# Patient Record
Sex: Male | Born: 1965 | Race: White | Hispanic: No | Smoking: Current every day smoker
Health system: Southern US, Community
[De-identification: ages and names within clinical notes are randomized; demographics above are authoritative.]

## PROBLEM LIST (undated history)

## (undated) DIAGNOSIS — Z720 Tobacco use: Secondary | ICD-10-CM

## (undated) DIAGNOSIS — M1990 Unspecified osteoarthritis, unspecified site: Secondary | ICD-10-CM

## (undated) DIAGNOSIS — I10 Essential (primary) hypertension: Secondary | ICD-10-CM

## (undated) DIAGNOSIS — I82409 Acute embolism and thrombosis of unspecified deep veins of unspecified lower extremity: Secondary | ICD-10-CM

## (undated) DIAGNOSIS — C439 Malignant melanoma of skin, unspecified: Secondary | ICD-10-CM

## (undated) DIAGNOSIS — Z951 Presence of aortocoronary bypass graft: Secondary | ICD-10-CM

## (undated) DIAGNOSIS — I2510 Atherosclerotic heart disease of native coronary artery without angina pectoris: Secondary | ICD-10-CM

## (undated) DIAGNOSIS — I214 Non-ST elevation (NSTEMI) myocardial infarction: Secondary | ICD-10-CM

## (undated) DIAGNOSIS — E7800 Pure hypercholesterolemia, unspecified: Secondary | ICD-10-CM

## (undated) HISTORY — PX: KNEE ARTHROSCOPY: SHX127

## (undated) HISTORY — PX: BACK SURGERY: SHX140

## (undated) HISTORY — DX: Acute embolism and thrombosis of unspecified deep veins of unspecified lower extremity: I82.409

## (undated) HISTORY — PX: TOTAL ANKLE REPLACEMENT: SUR1218

## (undated) HISTORY — PX: SHOULDER SURGERY: SHX246

## (undated) HISTORY — PX: HIP SURGERY: SHX245

---

## 2006-05-02 ENCOUNTER — Inpatient Hospital Stay: Payer: Self-pay | Admitting: Unknown Physician Specialty

## 2006-05-03 ENCOUNTER — Other Ambulatory Visit: Payer: Self-pay

## 2006-11-10 ENCOUNTER — Ambulatory Visit (HOSPITAL_COMMUNITY): Admission: RE | Admit: 2006-11-10 | Discharge: 2006-11-10 | Payer: Self-pay | Admitting: General Surgery

## 2008-06-04 ENCOUNTER — Ambulatory Visit: Payer: Self-pay

## 2008-07-26 ENCOUNTER — Other Ambulatory Visit: Payer: Self-pay

## 2008-07-26 ENCOUNTER — Ambulatory Visit: Payer: Self-pay | Admitting: Unknown Physician Specialty

## 2008-08-02 ENCOUNTER — Ambulatory Visit: Payer: Self-pay | Admitting: Unknown Physician Specialty

## 2012-03-04 ENCOUNTER — Inpatient Hospital Stay: Payer: Self-pay | Admitting: Surgery

## 2012-03-04 LAB — COMPREHENSIVE METABOLIC PANEL
Albumin: 3.5 g/dL (ref 3.4–5.0)
Alkaline Phosphatase: 96 U/L (ref 50–136)
BUN: 12 mg/dL (ref 7–18)
Bilirubin,Total: 1.1 mg/dL — ABNORMAL HIGH (ref 0.2–1.0)
Calcium, Total: 8.8 mg/dL (ref 8.5–10.1)
Chloride: 103 mmol/L (ref 98–107)
Co2: 27 mmol/L (ref 21–32)
Creatinine: 1.1 mg/dL (ref 0.60–1.30)
EGFR (African American): >60
EGFR (Non-African Amer.): >60
Osmolality: 272 (ref 275–301)
Potassium: 3.7 mmol/L (ref 3.5–5.1)
SGOT(AST): 17 U/L (ref 15–37)
SGPT (ALT): 26 U/L
Sodium: 137 mmol/L (ref 136–145)
Total Protein: 7.8 g/dL (ref 6.4–8.2)

## 2012-03-04 LAB — CBC
HCT: 44.9 % (ref 40.0–52.0)
MCH: 30 pg (ref 26.0–34.0)
MCHC: 34.3 g/dL (ref 32.0–36.0)
MCV: 88 fL (ref 80–100)
Platelet: 200 10*3/uL (ref 150–440)
RDW: 14.8 % — ABNORMAL HIGH (ref 11.5–14.5)

## 2012-03-10 LAB — WOUND CULTURE

## 2014-04-03 ENCOUNTER — Other Ambulatory Visit: Payer: Self-pay | Admitting: *Deleted

## 2014-04-03 ENCOUNTER — Encounter (HOSPITAL_COMMUNITY): Payer: Self-pay | Admitting: Emergency Medicine

## 2014-04-03 ENCOUNTER — Inpatient Hospital Stay (HOSPITAL_COMMUNITY): Payer: BC Managed Care – PPO

## 2014-04-03 ENCOUNTER — Emergency Department (HOSPITAL_COMMUNITY): Payer: BC Managed Care – PPO

## 2014-04-03 ENCOUNTER — Other Ambulatory Visit: Payer: Self-pay

## 2014-04-03 ENCOUNTER — Inpatient Hospital Stay (HOSPITAL_COMMUNITY)
Admission: EM | Admit: 2014-04-03 | Discharge: 2014-04-09 | DRG: 234 | Disposition: A | Discharge status: Home / Self Care | Payer: BC Managed Care – PPO | Attending: Thoracic Surgery (Cardiothoracic Vascular Surgery) | Admitting: Thoracic Surgery (Cardiothoracic Vascular Surgery)

## 2014-04-03 ENCOUNTER — Encounter (HOSPITAL_COMMUNITY)
Admission: EM | Disposition: A | Discharge status: Home / Self Care | Payer: Self-pay | Source: Home / Self Care | Attending: Thoracic Surgery (Cardiothoracic Vascular Surgery)

## 2014-04-03 DIAGNOSIS — I214 Non-ST elevation (NSTEMI) myocardial infarction: Secondary | ICD-10-CM | POA: Diagnosis present

## 2014-04-03 DIAGNOSIS — E662 Morbid (severe) obesity with alveolar hypoventilation: Secondary | ICD-10-CM | POA: Diagnosis present

## 2014-04-03 DIAGNOSIS — M1990 Unspecified osteoarthritis, unspecified site: Secondary | ICD-10-CM | POA: Insufficient documentation

## 2014-04-03 DIAGNOSIS — Z6841 Body Mass Index (BMI) 40.0 and over, adult: Secondary | ICD-10-CM

## 2014-04-03 DIAGNOSIS — I2789 Other specified pulmonary heart diseases: Secondary | ICD-10-CM | POA: Diagnosis present

## 2014-04-03 DIAGNOSIS — E785 Hyperlipidemia, unspecified: Secondary | ICD-10-CM | POA: Diagnosis present

## 2014-04-03 DIAGNOSIS — I2510 Atherosclerotic heart disease of native coronary artery without angina pectoris: Secondary | ICD-10-CM | POA: Diagnosis present

## 2014-04-03 DIAGNOSIS — I739 Peripheral vascular disease, unspecified: Secondary | ICD-10-CM

## 2014-04-03 DIAGNOSIS — I10 Essential (primary) hypertension: Secondary | ICD-10-CM | POA: Diagnosis present

## 2014-04-03 DIAGNOSIS — R000 Tachycardia, unspecified: Secondary | ICD-10-CM

## 2014-04-03 DIAGNOSIS — D62 Acute posthemorrhagic anemia: Secondary | ICD-10-CM | POA: Diagnosis not present

## 2014-04-03 DIAGNOSIS — Z720 Tobacco use: Secondary | ICD-10-CM | POA: Insufficient documentation

## 2014-04-03 DIAGNOSIS — Y849 Medical procedure, unspecified as the cause of abnormal reaction of the patient, or of later complication, without mention of misadventure at the time of the procedure: Secondary | ICD-10-CM | POA: Diagnosis not present

## 2014-04-03 DIAGNOSIS — I213 ST elevation (STEMI) myocardial infarction of unspecified site: Secondary | ICD-10-CM

## 2014-04-03 DIAGNOSIS — J95811 Postprocedural pneumothorax: Secondary | ICD-10-CM | POA: Diagnosis not present

## 2014-04-03 DIAGNOSIS — I498 Other specified cardiac arrhythmias: Secondary | ICD-10-CM | POA: Diagnosis not present

## 2014-04-03 DIAGNOSIS — Z01810 Encounter for preprocedural cardiovascular examination: Secondary | ICD-10-CM

## 2014-04-03 DIAGNOSIS — Z9861 Coronary angioplasty status: Secondary | ICD-10-CM

## 2014-04-03 DIAGNOSIS — J988 Other specified respiratory disorders: Secondary | ICD-10-CM | POA: Diagnosis not present

## 2014-04-03 DIAGNOSIS — E7800 Pure hypercholesterolemia, unspecified: Secondary | ICD-10-CM | POA: Diagnosis present

## 2014-04-03 DIAGNOSIS — Z951 Presence of aortocoronary bypass graft: Secondary | ICD-10-CM

## 2014-04-03 DIAGNOSIS — J9819 Other pulmonary collapse: Secondary | ICD-10-CM | POA: Diagnosis not present

## 2014-04-03 DIAGNOSIS — G4733 Obstructive sleep apnea (adult) (pediatric): Secondary | ICD-10-CM | POA: Diagnosis present

## 2014-04-03 DIAGNOSIS — Z85820 Personal history of malignant melanoma of skin: Secondary | ICD-10-CM

## 2014-04-03 DIAGNOSIS — F17200 Nicotine dependence, unspecified, uncomplicated: Secondary | ICD-10-CM | POA: Diagnosis present

## 2014-04-03 HISTORY — DX: Pure hypercholesterolemia, unspecified: E78.00

## 2014-04-03 HISTORY — PX: LEFT HEART CATHETERIZATION WITH CORONARY ANGIOGRAM: SHX5451

## 2014-04-03 HISTORY — DX: Presence of aortocoronary bypass graft: Z95.1

## 2014-04-03 HISTORY — DX: Tobacco use: Z72.0

## 2014-04-03 HISTORY — DX: Unspecified osteoarthritis, unspecified site: M19.90

## 2014-04-03 HISTORY — DX: Essential (primary) hypertension: I10

## 2014-04-03 HISTORY — DX: Non-ST elevation (NSTEMI) myocardial infarction: I21.4

## 2014-04-03 HISTORY — DX: Malignant melanoma of skin, unspecified: C43.9

## 2014-04-03 HISTORY — DX: Atherosclerotic heart disease of native coronary artery without angina pectoris: I25.10

## 2014-04-03 HISTORY — DX: Morbid (severe) obesity due to excess calories: E66.01

## 2014-04-03 LAB — URINALYSIS, ROUTINE W REFLEX MICROSCOPIC
BILIRUBIN URINE: NEGATIVE
Glucose, UA: NEGATIVE mg/dL
HGB URINE DIPSTICK: NEGATIVE
Ketones, ur: NEGATIVE mg/dL
Leukocytes, UA: NEGATIVE
Nitrite: NEGATIVE
PH: 6 (ref 5.0–8.0)
Protein, ur: NEGATIVE mg/dL
Specific Gravity, Urine: 1.035 — ABNORMAL HIGH (ref 1.005–1.030)
UROBILINOGEN UA: 2 mg/dL — AB (ref 0.0–1.0)

## 2014-04-03 LAB — BLOOD GAS, ARTERIAL
Acid-Base Excess: 0.3 mmol/L (ref 0.0–2.0)
Bicarbonate: 24.2 mEq/L — ABNORMAL HIGH (ref 20.0–24.0)
DRAWN BY: 39866
FIO2: 0.21 %
O2 Saturation: 96.9 %
PATIENT TEMPERATURE: 98.5
PH ART: 7.423 (ref 7.350–7.450)
TCO2: 25.3 mmol/L (ref 0–100)
pCO2 arterial: 37.6 mmHg (ref 35.0–45.0)
pO2, Arterial: 86.1 mmHg (ref 80.0–100.0)

## 2014-04-03 LAB — COMPREHENSIVE METABOLIC PANEL
ALBUMIN: 3.4 g/dL — AB (ref 3.5–5.2)
ALT: 21 U/L (ref 0–53)
AST: 23 U/L (ref 0–37)
Alkaline Phosphatase: 91 U/L (ref 39–117)
BUN: 11 mg/dL (ref 6–23)
CALCIUM: 9.5 mg/dL (ref 8.4–10.5)
CO2: 22 mEq/L (ref 19–32)
CREATININE: 0.91 mg/dL (ref 0.50–1.35)
Chloride: 100 mEq/L (ref 96–112)
GFR calc Af Amer: >90 mL/min (ref >90)
GFR calc non Af Amer: >90 mL/min (ref >90)
Glucose, Bld: 86 mg/dL (ref 70–99)
Potassium: 4.7 mEq/L (ref 3.7–5.3)
SODIUM: 139 meq/L (ref 137–147)
TOTAL PROTEIN: 6.8 g/dL (ref 6.0–8.3)
Total Bilirubin: 0.3 mg/dL (ref 0.3–1.2)

## 2014-04-03 LAB — TROPONIN I: Troponin I: 1.52 ng/mL (ref <0.30)

## 2014-04-03 LAB — TYPE AND SCREEN
ABO/RH(D): A POS
ANTIBODY SCREEN: NEGATIVE

## 2014-04-03 LAB — APTT: aPTT: 34 seconds (ref 24–37)

## 2014-04-03 LAB — I-STAT TROPONIN, ED: Troponin i, poc: 0.37 ng/mL (ref 0.00–0.08)

## 2014-04-03 LAB — BASIC METABOLIC PANEL
BUN: 11 mg/dL (ref 6–23)
CO2: 25 mEq/L (ref 19–32)
Calcium: 10.2 mg/dL (ref 8.4–10.5)
Chloride: 96 mEq/L (ref 96–112)
Creatinine, Ser: 1.1 mg/dL (ref 0.50–1.35)
GFR calc non Af Amer: 79 mL/min — ABNORMAL LOW (ref >90)
GLUCOSE: 85 mg/dL (ref 70–99)
POTASSIUM: 4.2 meq/L (ref 3.7–5.3)
Sodium: 138 mEq/L (ref 137–147)

## 2014-04-03 LAB — SURGICAL PCR SCREEN
MRSA, PCR: NEGATIVE
Staphylococcus aureus: NEGATIVE

## 2014-04-03 LAB — CBC
HEMATOCRIT: 48.3 % (ref 39.0–52.0)
HEMOGLOBIN: 16.9 g/dL (ref 13.0–17.0)
MCH: 30.7 pg (ref 26.0–34.0)
MCHC: 35 g/dL (ref 30.0–36.0)
MCV: 87.7 fL (ref 78.0–100.0)
Platelets: 206 10*3/uL (ref 150–400)
RBC: 5.51 MIL/uL (ref 4.22–5.81)
RDW: 13.9 % (ref 11.5–15.5)
WBC: 8.6 10*3/uL (ref 4.0–10.5)

## 2014-04-03 LAB — HEMOGLOBIN A1C
Hgb A1c MFr Bld: 5.1 % (ref <5.7)
MEAN PLASMA GLUCOSE: 100 mg/dL (ref <117)

## 2014-04-03 LAB — PRO B NATRIURETIC PEPTIDE: Pro B Natriuretic peptide (BNP): 38.9 pg/mL (ref 0–125)

## 2014-04-03 LAB — TSH: TSH: 1.27 u[IU]/mL (ref 0.350–4.500)

## 2014-04-03 LAB — PROTIME-INR
INR: 0.98 (ref 0.00–1.49)
PROTHROMBIN TIME: 12.8 s (ref 11.6–15.2)

## 2014-04-03 LAB — ABO/RH: ABO/RH(D): A POS

## 2014-04-03 SURGERY — LEFT HEART CATHETERIZATION WITH CORONARY ANGIOGRAM
Anesthesia: LOCAL

## 2014-04-03 MED ORDER — NITROGLYCERIN IN D5W 200-5 MCG/ML-% IV SOLN: 2.0000 ug/min | INTRAVENOUS | Status: DC

## 2014-04-03 MED ORDER — SODIUM CHLORIDE 0.9 % IJ SOLN
3.0000 mL | Freq: Two times a day (BID) | INTRAMUSCULAR | Status: DC
  Administered 2014-04-03: 3 mL via INTRAVENOUS

## 2014-04-03 MED ORDER — VANCOMYCIN HCL 10 G IV SOLR
1500.0000 mg | INTRAVENOUS | Status: DC
  Filled 2014-04-03: qty 1500

## 2014-04-03 MED ORDER — SODIUM CHLORIDE 0.9 % IJ SOLN: 3.0000 mL | INTRAMUSCULAR | Status: DC | PRN

## 2014-04-03 MED ORDER — HEPARIN (PORCINE) IN NACL 100-0.45 UNIT/ML-% IJ SOLN: 1500.0000 [IU]/h | INTRAMUSCULAR | Status: DC

## 2014-04-03 MED ORDER — VANCOMYCIN HCL 1000 MG IV SOLR
INTRAVENOUS | Status: DC
  Filled 2014-04-03: qty 1000

## 2014-04-03 MED ORDER — NITROGLYCERIN 0.4 MG SL SUBL: 0.4000 mg | SUBLINGUAL_TABLET | SUBLINGUAL | Status: DC | PRN

## 2014-04-03 MED ORDER — SODIUM CHLORIDE 0.9 % IV SOLN: INTRAVENOUS | Status: DC

## 2014-04-03 MED ORDER — HEPARIN SODIUM (PORCINE) 5000 UNIT/ML IJ SOLN: 4000.0000 [IU] | INTRAMUSCULAR | Status: DC

## 2014-04-03 MED ORDER — NITROGLYCERIN IN D5W 200-5 MCG/ML-% IV SOLN
2.0000 ug/min | INTRAVENOUS | Status: DC
  Filled 2014-04-03: qty 250

## 2014-04-03 MED ORDER — SODIUM CHLORIDE 0.9 % IV SOLN
INTRAVENOUS | Status: DC
  Filled 2014-04-03: qty 40

## 2014-04-03 MED ORDER — HEPARIN (PORCINE) IN NACL 2-0.9 UNIT/ML-% IJ SOLN
INTRAMUSCULAR | Status: AC
  Filled 2014-04-03: qty 1000

## 2014-04-03 MED ORDER — PHENYLEPHRINE HCL 10 MG/ML IJ SOLN
30.0000 ug/min | INTRAMUSCULAR | Status: DC
  Filled 2014-04-03: qty 2

## 2014-04-03 MED ORDER — HEPARIN SODIUM (PORCINE) 1000 UNIT/ML IJ SOLN
INTRAMUSCULAR | Status: AC
  Filled 2014-04-03: qty 1

## 2014-04-03 MED ORDER — POTASSIUM CHLORIDE 2 MEQ/ML IV SOLN
80.0000 meq | INTRAVENOUS | Status: DC
  Filled 2014-04-03: qty 40

## 2014-04-03 MED ORDER — SODIUM CHLORIDE 0.9 % IV SOLN
INTRAVENOUS | Status: DC
  Filled 2014-04-03: qty 1

## 2014-04-03 MED ORDER — MAGNESIUM SULFATE 50 % IJ SOLN
40.0000 meq | INTRAMUSCULAR | Status: DC
Start: 2014-04-04 — End: 2014-04-04
  Filled 2014-04-03: qty 10

## 2014-04-03 MED ORDER — DEXMEDETOMIDINE HCL IN NACL 400 MCG/100ML IV SOLN
0.1000 ug/kg/h | INTRAVENOUS | Status: DC
  Filled 2014-04-03: qty 100

## 2014-04-03 MED ORDER — NITROGLYCERIN IN D5W 200-5 MCG/ML-% IV SOLN
INTRAVENOUS | Status: AC
  Filled 2014-04-03: qty 250

## 2014-04-03 MED ORDER — NITROGLYCERIN 0.2 MG/ML ON CALL CATH LAB
INTRAVENOUS | Status: AC
  Filled 2014-04-03: qty 1

## 2014-04-03 MED ORDER — SODIUM CHLORIDE 0.9 % IV SOLN
INTRAVENOUS | Status: DC
  Filled 2014-04-03: qty 30

## 2014-04-03 MED ORDER — ONDANSETRON HCL 4 MG/2ML IJ SOLN: 4.0000 mg | Freq: Four times a day (QID) | INTRAMUSCULAR | Status: DC | PRN

## 2014-04-03 MED ORDER — FENTANYL CITRATE 0.05 MG/ML IJ SOLN
INTRAMUSCULAR | Status: AC
  Filled 2014-04-03: qty 2

## 2014-04-03 MED ORDER — EPINEPHRINE HCL 1 MG/ML IJ SOLN
0.5000 ug/min | INTRAVENOUS | Status: DC
  Filled 2014-04-03: qty 4

## 2014-04-03 MED ORDER — LIDOCAINE HCL (PF) 1 % IJ SOLN
INTRAMUSCULAR | Status: AC
  Filled 2014-04-03: qty 30

## 2014-04-03 MED ORDER — SODIUM CHLORIDE 0.9 % IV SOLN
INTRAVENOUS | Status: AC
  Administered 2014-04-03: 17:00:00 via INTRAVENOUS

## 2014-04-03 MED ORDER — ALPRAZOLAM 0.25 MG PO TABS
0.2500 mg | ORAL_TABLET | Freq: Two times a day (BID) | ORAL | Status: DC | PRN
  Administered 2014-04-03 – 2014-04-04 (×2): 0.25 mg via ORAL
  Filled 2014-04-03 (×2): qty 1

## 2014-04-03 MED ORDER — TEMAZEPAM 15 MG PO CAPS
15.0000 mg | ORAL_CAPSULE | Freq: Once | ORAL | Status: AC | PRN
  Administered 2014-04-03: 15 mg via ORAL
  Filled 2014-04-03: qty 1

## 2014-04-03 MED ORDER — ACETAMINOPHEN 325 MG PO TABS
650.0000 mg | ORAL_TABLET | ORAL | Status: DC | PRN
  Administered 2014-04-03: 650 mg via ORAL
  Filled 2014-04-03: qty 2

## 2014-04-03 MED ORDER — BIOTENE DRY MOUTH MT LIQD
15.0000 mL | Freq: Two times a day (BID) | OROMUCOSAL | Status: DC
  Administered 2014-04-03: 15 mL via OROMUCOSAL

## 2014-04-03 MED ORDER — HEPARIN (PORCINE) IN NACL 100-0.45 UNIT/ML-% IJ SOLN
1500.0000 [IU]/h | INTRAMUSCULAR | Status: AC
  Administered 2014-04-03: 1500 [IU]/h via INTRAVENOUS
  Filled 2014-04-03: qty 250

## 2014-04-03 MED ORDER — VERAPAMIL HCL 2.5 MG/ML IV SOLN
INTRAVENOUS | Status: AC
  Filled 2014-04-03: qty 2

## 2014-04-03 MED ORDER — DOPAMINE-DEXTROSE 3.2-5 MG/ML-% IV SOLN
2.0000 ug/kg/min | INTRAVENOUS | Status: DC
  Filled 2014-04-03: qty 250

## 2014-04-03 MED ORDER — BISACODYL 5 MG PO TBEC: 5.0000 mg | DELAYED_RELEASE_TABLET | Freq: Once | ORAL | Status: DC

## 2014-04-03 MED ORDER — PLASMA-LYTE 148 IV SOLN
INTRAVENOUS | Status: DC
  Filled 2014-04-03: qty 2.5

## 2014-04-03 MED ORDER — DEXTROSE 5 % IV SOLN
1.5000 g | INTRAVENOUS | Status: DC
  Filled 2014-04-03: qty 1.5

## 2014-04-03 MED ORDER — CHLORHEXIDINE GLUCONATE 4 % EX LIQD
60.0000 mL | Freq: Once | CUTANEOUS | Status: AC
  Administered 2014-04-03: 4 via TOPICAL
  Filled 2014-04-03: qty 60

## 2014-04-03 MED ORDER — SODIUM CHLORIDE 0.9 % IV SOLN: 250.0000 mL | INTRAVENOUS | Status: DC | PRN

## 2014-04-03 MED ORDER — CHLORHEXIDINE GLUCONATE 4 % EX LIQD
60.0000 mL | Freq: Once | CUTANEOUS | Status: AC
  Administered 2014-04-04: 4 via TOPICAL
  Filled 2014-04-03: qty 60

## 2014-04-03 MED ORDER — METOPROLOL TARTRATE 12.5 MG HALF TABLET
12.5000 mg | ORAL_TABLET | Freq: Once | ORAL | Status: AC
  Administered 2014-04-04: 12.5 mg via ORAL
  Filled 2014-04-03: qty 1

## 2014-04-03 MED ORDER — DEXTROSE 5 % IV SOLN
750.0000 mg | INTRAVENOUS | Status: DC
  Filled 2014-04-03 (×2): qty 750

## 2014-04-03 MED ORDER — CARVEDILOL 6.25 MG PO TABS
6.2500 mg | ORAL_TABLET | Freq: Two times a day (BID) | ORAL | Status: DC
  Administered 2014-04-03: 6.25 mg via ORAL
  Filled 2014-04-03 (×4): qty 1

## 2014-04-03 MED ORDER — MIDAZOLAM HCL 2 MG/2ML IJ SOLN
INTRAMUSCULAR | Status: AC
  Filled 2014-04-03: qty 2

## 2014-04-03 MED ORDER — ATORVASTATIN CALCIUM 80 MG PO TABS
80.0000 mg | ORAL_TABLET | Freq: Every day | ORAL | Status: DC
  Administered 2014-04-03: 80 mg via ORAL
  Filled 2014-04-03 (×2): qty 1

## 2014-04-03 MED ORDER — ZOLPIDEM TARTRATE 5 MG PO TABS: 5.0000 mg | ORAL_TABLET | Freq: Every evening | ORAL | Status: DC | PRN

## 2014-04-03 NOTE — Progress Notes (Signed)
ANTICOAGULATION CONSULT NOTE - Initial Consult  Pharmacy Consult for heparin Indication: chest pain/ACS  Allergies  Allergen Reactions  . Bee Venom Anaphylaxis  . Cortisone Other (See Comments)    Chest pain    Patient Measurements: Height: 6' (182.9 cm) Weight: 350 lb (158.759 kg) IBW/kg (Calculated) : 77.6 Heparin Dosing Weight: 114kg  Vital Signs: Temp: 98.7 F (37.1 C) (06/09 1343) Temp src: Oral (06/09 1343) BP: 187/87 mmHg (06/09 1400) Pulse Rate: 83 (06/09 1427)  Labs:  Recent Labs  04/03/14 1410  HGB 16.9  HCT 48.3  PLT 206  APTT 34  LABPROT 12.8  INR 0.98  CREATININE 1.10    Estimated Creatinine Clearance: 132.1 ml/min (by C-G formula based on Cr of 1.1).   Medical History: Past Medical History  Diagnosis Date  . Hypertension   . High cholesterol     Medications:  Medication history pending  Assessment: 48 year old male presents to [facility] with chest pain during the past 2 weeks. Patient taken emergently to cath and found to have severe multivessel coronary artery disease with total occlusion of the RCA, total occlusion left circumflex, severe proximal LAD stenosis, and mild to moderate left main stenosis. A cardiac surgery consult is pending for possible OHS tomorrow. Cath performed radially, no hematoma or complication noted, orders to start heparin this afternoon.  Goal of Therapy:  Heparin level 0.3-0.7 units/ml Monitor platelets by anticoagulation protocol: Yes   Plan:  Start heparin infusion at 1500 units/hr Check anti-Xa level in 6 hours and daily while on heparin Continue to monitor H&H and platelets  Erin Hearing PharmD., BCPS Clinical Pharmacist Pager [phone] 04/03/2014 3:52 PM

## 2014-04-03 NOTE — Progress Notes (Addendum)
VASCULAR LAB PRELIMINARY  PRELIMINARY  PRELIMINARY  PRELIMINARY  Pre-op Cardiac Surgery  Carotid Findings:  Bilateral:  1-39% ICA stenosis.  Vertebral artery flow is antegrade.      Upper Extremity Right Left  Brachial Pressures Pressure not obtained due to restrictions. Triphasic waveform. 136 Triphasic  Radial Waveforms Triphasic Triphasic  Ulnar Waveforms Triphasic Triphasic  Palmar Arch (Allen's Test) Not obtained due to radial cath cuff.  Remains normal with radial compression. Obliterates with ulnar compression.   Findings:      Lower  Extremity Right Left  Dorsalis Pedis    Anterior Tibial    Posterior Tibial    Ankle/Brachial Indices      Findings:  Palpable pedal pulses x 4.   Charlaine Dalton, RVT 04/03/2014, 4:33 PM

## 2014-04-03 NOTE — Progress Notes (Signed)
CRITICAL VALUE ALERT  Critical value received:  Troponin 1.52  Date of notification:  04/03/14  Time of notification:  6244  Critical value read back:yes  Nurse who received alert:  Rafael Bihari   MD notified (1st page):  Cecilie Kicks, NP  Time of first page:  1811  MD notified (2nd page):  Time of second page:  Responding MD: Cecilie Kicks, NP  Time MD responded:  [phone]

## 2014-04-03 NOTE — ED Provider Notes (Signed)
CSN: [id number]     Arrival date & time 04/03/14  1336 History   First MD Initiated Contact with Patient 04/03/14 1343     Chief Complaint  Patient presents with  . Chest Pain     (Consider location/radiation/quality/duration/timing/severity/associated sxs/prior Treatment) HPI Comments: Patient presents today with a chief complaint of chest pain.  He reports that he has been having exertional chest pain intermittently over the past 2 weeks.  Pain typically lasts 5-10 minutes and then resolves with rest.  He reports that today around 12:30 PM he began having chest pain at rest that lasted for an hour.  Pain located substernal and radiated down his left arm.  Pain associated with diaphoresis, nausea, and tingling of his left arm.  He denies SOB or abdominal pain.  He was given 324 ASA and NG en route to the ED, which he reports helped his pain.  He denies chest pain at this time.  He denies prior cardiac history.  He has never had a stress test or Cardiac Cath.  He does have a history of HTN and Hyperlipidemia.  Denies history of DM.  He reports that he does smoke "a lot."    Patient is a 48 y.o. male presenting with chest pain. The history is provided by the patient.  Chest Pain Associated symptoms: diaphoresis, nausea and numbness     Past Medical History  Diagnosis Date  . Hypertension   . High cholesterol    Past Surgical History  Procedure Laterality Date  . Back surgery    . Hip surgery Left   . Total ankle replacement Left   . Shoulder surgery Right   . Knee arthroscopy Left    History reviewed. No pertinent family history. History  Substance Use Topics  . Smoking status: Current Every Day Smoker -- 2.00 packs/day    Types: Cigarettes  . Smokeless tobacco: Never Used  . Alcohol Use: Yes     Comment: occassional    Review of Systems  Constitutional: Positive for diaphoresis.  Cardiovascular: Positive for chest pain.  Gastrointestinal: Positive for nausea.  Neurological:  Positive for numbness.  All other systems reviewed and are negative.     Allergies  Cortisone  Home Medications   Prior to Admission medications   Not on File   BP 158/97  Pulse 96  Temp(Src) 98.7 F (37.1 C) (Oral)  SpO2 100% Physical Exam  Nursing note and vitals reviewed. Constitutional: He appears well-developed and well-nourished.  HENT:  Head: Normocephalic and atraumatic.  Mouth/Throat: Oropharynx is clear and moist.  Neck: Normal range of motion. Neck supple.  Cardiovascular: Normal rate, regular rhythm and normal heart sounds.   Pulmonary/Chest: Effort normal and breath sounds normal.  Abdominal: Soft. There is no tenderness.  Neurological: He is alert.  Skin: Skin is warm and dry.  Psychiatric: He has a normal mood and affect.    ED Course  Procedures (including critical care time) Labs Review Labs Reviewed  CBC  BASIC METABOLIC PANEL  PRO B NATRIURETIC PEPTIDE  APTT  PROTIME-INR  I-STAT [place], ED  I-STAT TROPOININ, ED    Imaging Review No results found.   EKG Interpretation   Date/Time:  Tuesday April 03 2014 13:36:44 EDT Ventricular Rate:  92 PR Interval:  133 QRS Duration: 96 QT Interval:  352 QTC Calculation: 435 R Axis:   41 Text Interpretation:  Sinus rhythm `st depression II//III/AVF and v3-v6.   ST elev avr No previous tracing Confirmed by Ashok Cordia  MD,  Lennette Bihari ([id number]) on  04/03/2014 1:52:24 PM     1:55 PM Dr. Ashok Cordia called a CODE STEMI  MDM   Final diagnoses:  None   Patient with risk factors of Hyperlipidemia, HTN, smoking, and obesity presents today with chest pain.  Ischemic changes on EKG.  CODE STEMI called by Dr. Ashok Cordia immediately.  Troponin elevated.  Patient went directly to Cath Lab.    Hyman Bible, PA-C 04/03/14 1521

## 2014-04-03 NOTE — Care Management Note (Signed)
    Page 1 of 1   04/03/2014     3:46:20 PM CARE MANAGEMENT NOTE 04/03/2014  Patient:  Fernando Schmidt, Fernando Schmidt   Account Number:  [id number]  Date Initiated:  04/03/2014  Documentation initiated by:  Elissa Hefty  Subjective/Objective Assessment:   adm w mi, for cvts eval     Action/Plan:   lives w fam   Anticipated DC Date:     Anticipated DC Plan:           Choice offered to / List presented to:             Status of service:   Medicare Important Message given?   (If response is "NO", the following Medicare IM given date fields will be blank) Date Medicare IM given:   Date Additional Medicare IM given:    Discharge Disposition:    Per UR Regulation:  Reviewed for med. necessity/level of care/duration of stay  If discussed at [place] of Stay Meetings, dates discussed:    Comments:

## 2014-04-03 NOTE — Consult Note (Signed)
[name]       [place] [address]             [phone]          CARDIOTHORACIC SURGERY CONSULTATION REPORT  PCP is No primary provider on file. Referring Provider is Fernando Mocha, MD   Reason for consultation:  Severe 3-vessel CAD s/p acute non-STEMI  HPI:  Patient is a 48 year old morbidly obese white male with no previous history of coronary artery disease but multiple risk factors including hypertension, hyperlipidemia, long-standing tobacco abuse, and a strong family history of coronary artery disease. The patient reports eating in his usual state of health until approximately 2 or 3 weeks ago when he first began to experience symptoms of exertional substernal chest tightness and shortness of breath.  Symptoms initially always resolved with rest but gradually increased in both frequency and intensity. Earlier today the patient developed severe onset of substernal chest tightness while he was at work. Symptoms persisted and became progressively severe and associated with resting shortness of breath. EMS was called and the patient was brought to the emergency department. Intravenous port the patient received sublingual nitroglycerin and aspirin.  By the time the patient arrived his chest discomfort had nearly completely resolved.  EKG revealed sinus rhythm with diffuse ST segment depression. The patient was brought directly to the cardiac cath lab by Dr. Burt Knack.  Catheterization revealed severe three-vessel coronary artery disease with reasonably well-preserved left ventricular systolic function.  Cardiothoracic surgical consultation was requested. By the time the patient left the cath lab and was transported to the cardiac intensive care unit his chest pain had completely resolved.  The patient is divorced and lives alone in [place].  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  He has been morbidly obese for the majority of his life. He smokes 2 packs of cigarettes daily. He lives a sedentary lifestyle when he is not at work.  Prior to that mentioned as part of his present illness the patient denies any previous history of exertional chest tightness or shortness of breath. He denies any history PND, orthopnea, tachypalpitations, dizzy spells, or syncope. He reports chronic bilateral lower extremity edema.  He has been seen in the past at a family medicine clinic [place], but he does not take any medications regular for any of his chronic medical problems.  Past Medical History  Diagnosis Date  . Hypertension   . High cholesterol   . Morbid obesity   . Tobacco abuse   . NSTEMI (non-ST elevated myocardial infarction) 04/03/2014  . Coronary artery disease 04/03/2014  . Arthritis   . Melanoma     Past Surgical History  Procedure Laterality Date  . Back surgery    . Hip surgery Left     melanoma excision  . Total ankle replacement Left   . Shoulder surgery Right   . Knee arthroscopy Left     Family History  Problem Relation Age of Onset  . Diabetes Mother     History   Social History  . Marital Status: Divorced    Spouse Name: N/A    Number of Children: N/A  . Years of Education: N/A   Occupational History  . Architect    Social History Main Topics  . Smoking status: Current Every Day Smoker -- 2.00 packs/day for 30 years    Types: Cigarettes  . Smokeless  tobacco: Never Used  . Alcohol Use: Yes     Comment: occassional  . Drug Use: No  . Sexual Activity: Yes    Birth Control/ Protection: Other-see comments     Comment: vasictomy   Other Topics Concern  . Not on file   Social History Narrative   Patient lives alone.    Prior to Admission medications   Not on File    Current Facility-Administered Medications  Medication Dose Route Frequency Provider Last Rate Last Dose  . 0.9 %  sodium chloride infusion   Intravenous  Continuous Fernando Mires, MD      . 0.9 %  sodium chloride infusion  250 mL Intravenous PRN Fernando G Barrett, PA-C      . 0.9 %  sodium chloride infusion   Intravenous Continuous Fernando Mocha, MD      . acetaminophen (TYLENOL) tablet 650 mg  650 mg Oral Q4H PRN Fernando G Barrett, PA-C      . ALPRAZolam Fernando Schmidt) tablet 0.25 mg  0.25 mg Oral BID PRN Fernando Schmidt Barrett, PA-C      . antiseptic oral rinse (BIOTENE) solution 15 mL  15 mL Mouth Rinse BID Fernando Mocha, MD      . atorvastatin (LIPITOR) tablet 80 mg  80 mg Oral q1800 Fernando G Barrett, PA-C      . carvedilol (COREG) tablet 6.25 mg  6.25 mg Oral BID WC Fernando G Barrett, PA-C      . heparin ADULT infusion 100 units/mL (25000 units/250 mL)  1,500 Units/hr Intravenous Continuous Georgina Schmidt, RPH      . nitroGLYCERIN (NITROSTAT) SL tablet 0.4 mg  0.4 mg Sublingual Q5 Min x 3 PRN Fernando G Barrett, PA-C      . ondansetron (ZOFRAN) injection 4 mg  4 mg Intravenous Q6H PRN Fernando G Barrett, PA-C      . sodium chloride 0.9 % injection 3 mL  3 mL Intravenous Q12H Fernando G Barrett, PA-C      . sodium chloride 0.9 % injection 3 mL  3 mL Intravenous PRN Fernando G Barrett, PA-C      . zolpidem (AMBIEN) tablet 5 mg  5 mg Oral QHS PRN,MR X 1 Fernando G Barrett, PA-C        Allergies  Allergen Reactions  . Bee Venom Anaphylaxis  . Cauliflower [Brassica Oleracea Italica] Anaphylaxis  . Cortisone Other (See Comments)    Chest pain      Review of Systems:   General:  normal appetite, normal energy, no weight gain, no weight loss, no fever  Cardiac:  + chest pain with exertion, + chest pain at rest, +SOB with + exertion, + resting SOB, no PND, no orthopnea, no palpitations, no arrhythmia, no atrial fibrillation, + chronic LE edema, no dizzy spells, no syncope  Respiratory:  + shortness of breath, no home oxygen, no productive cough, no dry cough, no bronchitis, no wheezing, no hemoptysis, no asthma, no pain with inspiration or cough, no sleep  apnea, no CPAP at night  GI:   no difficulty swallowing, no reflux, no frequent heartburn, no hiatal hernia, no abdominal pain, no constipation, no diarrhea, no hematochezia, no hematemesis, no melena  GU:   no dysuria,  no frequency, no urinary tract infection, no hematuria, no enlarged prostate, no kidney stones, no kidney disease  Vascular:  no pain suggestive of claudication, no pain in feet, no leg cramps, no varicose veins, no DVT, no non-healing foot ulcer  Neuro:  no stroke, no TIA's, no seizures, no headaches, no temporary blindness one eye,  no slurred speech, no peripheral neuropathy, + chronic pain, no instability of gait, no memory/cognitive dysfunction  Musculoskeletal: + arthritis, no joint swelling, no myalgias, no difficulty walking, normal mobility   Skin:   no rash, no itching, no skin infections, no pressure sores or ulcerations  Psych:   no anxiety, no depression, no nervousness, no unusual recent stress  Eyes:   no blurry vision, no floaters, no recent vision changes, does not wear glasses or contacts  ENT:   no hearing loss, no loose or painful teeth, no dentures, last saw dentist many years ago  Hematologic:  no easy bruising, no abnormal bleeding, no clotting disorder, no frequent epistaxis  Endocrine:  no diabetes, does not check CBG's at home     Physical Exam:   BP 187/87  Pulse 83  Temp(Src) 98.7 F (37.1 C) (Oral)  Resp 27  Ht 6' (1.829 m)  Wt 158.759 kg (350 lb)  BMI 47.46 kg/m2  SpO2 97%  General:  Morbidly obese,  No acute distress  HEENT:  Unremarkable other than poor dentition  Neck:   no JVD, no bruits, no adenopathy   Chest:   clear to auscultation, symmetrical breath sounds, no wheezes, no rhonchi   CV:   RRR, no murmur   Abdomen:  soft, non-tender, no masses   Extremities:  warm, well-perfused, pulses diminished, + bilateral lower extremity edema  Rectal/GU  Deferred  Neuro:   Grossly non-focal and symmetrical throughout  Skin:   Clean and  dry, no rashes, no breakdown, + changes both lower legs c/w chronic venous insufficiency  Diagnostic Tests:  Cardiac Catheterization Procedure Note  Name: Fernando Schmidt  MRN: [id number]  DOB: 1966/02/07  Procedure: Left Heart Cath, Selective Coronary Angiography, LV angiography, left subclavian angiography, abdominal aortic angiography.  Indication: ACS/NSTEMI. Ongoing ischemic symptoms with diffuse ST changes on EKG. Pt brought emergently for cardiac cath.  Procedural Details: The right wrist was prepped, draped, and anesthetized with 1% lidocaine. Using the modified Seldinger technique, a 5 French sheath was introduced into the right radial artery. 3 mg of verapamil was administered through the sheath, weight-based unfractionated heparin was administered intravenously. Standard Judkins catheters were used for selective coronary angiography and left ventriculography. Catheter exchanges were performed over an exchange length guidewire. There were no immediate procedural complications. A TR band was used for radial hemostasis at the completion of the procedure. The patient was transferred to the post catheterization recovery area for further monitoring.  Procedural Findings:  Hemodynamics:  AO 117/85 with a mean of 101  LV 120/33  Coronary angiography:  Coronary dominance: right  Left mainstem: The left main is patent with 50% distal stenosis. There is a shelflike plaque in the mid left main. Moderate calcification is noted  Left anterior descending (LAD): The LAD has diffuse 70% proximal stenosis before the first septal perforator. The first diagonal is small and totally occluded. The mid vessel has 50% stenosis before the second diagonal branch. The mid LAD is patent with mild nonobstructive disease as it reaches the apex.  Left circumflex (LCx): The left circumflex has 80% proximal stenosis. The mid circumflex is totally occluded. The marginal branches are occluded. The first OM fills late  from left to left collaterals.  Right coronary artery (RCA): The RCA is totally occluded just after the first RV marginal branch. The vessel has faint antegrade filling but primarily fills from left to right  collaterals. The PDA and PLA branches are patent by collateral filling.  Left ventriculography: The inferior wall is mildly hypokinetic. There does not appear to be significant mitral regurgitation. The estimated LVEF is 55-60%.  Left subclavian angiography: The subclavian is patent. The left internal mammary artery is patent and appears to be a suitable conduit for grafting.  Abdominal aortic angiography: The abdominal aorta is patent with single patent renal arteries bilaterally and no obvious evidence of aneurysm or significant stenosis  Final Conclusions:  1. Severe multivessel coronary artery disease with total occlusion of the RCA, total occlusion left circumflex, severe proximal LAD stenosis, and mild to moderate left main stenosis  2. Mild contraction abnormality the left ventricle with preserved overall LV systolic function  Recommendations: The patient has severe multivessel disease and presents with acute coronary syndrome and ongoing ischemic symptoms. He has initially stabilized on IV nitroglycerin. Emergency cardiac surgical consultation was obtained in the cath lab. The patient will be evaluated by Dr. Roxy Manns with tentative plans for urgent CABG tomorrow.  Fernando Schmidt  04/03/2014, 3:19 PM    Impression:  Severe three-vessel coronary artery disease with mild left ventricular dysfunction. The patient presents with acute coronary syndrome and non-ST segment elevation myocardial infarction. Symptoms have resolved with administration of nitroglycerin. I agree the patient would best be treated with surgical revascularization. Without risk factor modification the patient's long-term prognosis will be guarded regardless of how well he does with surgery.   Plan:  I have reviewed the  indications, risks, and potential benefits of coronary artery bypass grafting with the patient in his hospital room.  Alternative treatment strategies have been discussed.  The patient understands and accepts all potential associated risks of surgery including but not limited to risk of death, stroke or other neurologic complication, myocardial infarction, congestive heart failure, respiratory failure, renal failure, bleeding requiring blood transfusion and/or reexploration, aortic dissection or other major vascular complication, arrhythmia, heart block or bradycardia requiring permanent pacemaker, pneumonia, pleural effusion, wound infection, pulmonary embolus or other thromboembolic complication, chronic pain or other delayed complications related to median sternotomy, or the late recurrence of symptomatic ischemic heart disease and/or congestive heart failure.  The importance of long term risk modification have been emphasized.  All questions answered.  We plan to proceed with surgery first thing tomorrow morning.   I spent in excess of 120 minutes during the conduct of this hospital consultation and >50% of this time involved direct face-to-face encounter for counseling and/or coordination of the patient's care.    Valentina Gu. Roxy Manns, MD 04/03/2014 4:20 PM

## 2014-04-03 NOTE — Interval H&P Note (Signed)
History and Physical Interval Note:  04/03/2014 3:18 PM  Fernando Schmidt  has presented today for surgery, with the diagnosis of stemi  The various methods of treatment have been discussed with the patient and family. After consideration of risks, benefits and other options for treatment, the patient has consented to  Procedure(s): LEFT HEART CATHETERIZATION WITH CORONARY ANGIOGRAM (N/A) as a surgical intervention .  The patient's history has been reviewed, patient examined, no change in status, stable for surgery.  I have reviewed the patient's chart and labs.  Questions were answered to the patient's satisfaction.    Cath Lab Visit (complete for each Cath Lab visit)  Clinical Evaluation Leading to the Procedure:   ACS: yes  Non-ACS:    Anginal Classification: CCS IV  Anti-ischemic medical therapy: No Therapy  Non-Invasive Test Results: No non-invasive testing performed  Prior CABG: No previous CABG       Sherren Mocha

## 2014-04-03 NOTE — CV Procedure (Signed)
    Cardiac Catheterization Procedure Note  Name: Fernando Schmidt MRN: [id number] DOB: [birth year]  Procedure: Left Heart Cath, Selective Coronary Angiography, LV angiography, left subclavian angiography, abdominal aortic angiography.  Indication: ACS/NSTEMI. Ongoing ischemic symptoms with diffuse ST changes on EKG. Pt brought emergently for cardiac cath.   Procedural Details: The right wrist was prepped, draped, and anesthetized with 1% lidocaine. Using the modified Seldinger technique, a 5 French sheath was introduced into the right radial artery. 3 mg of verapamil was administered through the sheath, weight-based unfractionated heparin was administered intravenously. Standard Judkins catheters were used for selective coronary angiography and left ventriculography. Catheter exchanges were performed over an exchange length guidewire. There were no immediate procedural complications. A TR band was used for radial hemostasis at the completion of the procedure.  The patient was transferred to the post catheterization recovery area for further monitoring.  Procedural Findings: Hemodynamics: AO 117/85 with a mean of 101 LV 120/33  Coronary angiography: Coronary dominance: right  Left mainstem: The left main is patent with 50% distal stenosis. There is a shelflike plaque in the mid left main. Moderate calcification is noted  Left anterior descending (LAD): The LAD has diffuse 70% proximal stenosis before the first septal perforator. The first diagonal is small and totally occluded. The mid vessel has 50% stenosis before the second diagonal branch. The mid LAD is patent with mild nonobstructive disease as it reaches the apex.  Left circumflex (LCx): The left circumflex has 80% proximal stenosis. The mid circumflex is totally occluded. The marginal branches are occluded. The first OM fills late from left to left collaterals.  Right coronary artery (RCA): The RCA is totally occluded just after  the first RV marginal branch. The vessel has faint antegrade filling but primarily fills from left to right collaterals. The PDA and PLA branches are patent by collateral filling.  Left ventriculography: The inferior wall is mildly hypokinetic. There does not appear to be significant mitral regurgitation. The estimated LVEF is 55-60%.  Left subclavian angiography: The subclavian is patent. The left internal mammary artery is patent and appears to be a suitable conduit for grafting.  Abdominal aortic angiography: The abdominal aorta is patent with single patent renal arteries bilaterally and no obvious evidence of aneurysm or significant stenosis  Final Conclusions:   1. 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 Mild contraction abnormality the left ventricle with preserved overall LV systolic function  Recommendations: The patient has severe multivessel disease and presents with acute coronary syndrome and ongoing ischemic symptoms. He has initially stabilized on IV nitroglycerin. Emergency cardiac surgical consultation was obtained in the cath lab. The patient will be evaluated by Dr. Roxy Manns with tentative plans for urgent CABG tomorrow.  Sherren Mocha 04/03/2014, 3:19 PM

## 2014-04-03 NOTE — ED Notes (Signed)
Activated Code Stemi  

## 2014-04-03 NOTE — ED Notes (Signed)
Rhonda with Cardiology at bedside.

## 2014-04-03 NOTE — ED Notes (Signed)
Troponin results given to Dr. Ashok Cordia

## 2014-04-03 NOTE — ED Notes (Signed)
Transported patient to Cath Lab.

## 2014-04-03 NOTE — ED Notes (Signed)
Pt placed on Zoll with defibrillator pads

## 2014-04-03 NOTE — Progress Notes (Signed)
TR band removed.  Site level 0, right radial pulse +3.  Will continue to monitor. Fernando Schmidt

## 2014-04-03 NOTE — ED Notes (Addendum)
Fernando Schmidt Niece - [phone]

## 2014-04-03 NOTE — H&P (Signed)
History and Physical   Patient ID: Fernando Schmidt MRN: [id number], DOB/AGE: [birth year] 46 y.o. Date of Encounter: 04/03/2014  Primary Physician: [facility] Primary Cardiologist: new, will be Dr. Burt Knack  Chief Complaint:  STEMI  HPI: Fernando Schmidt is a 48 y.o. male with no history of CAD. He has a history of hypertension and hyperlipidemia as well as ongoing tobacco use and morbid obesity. He is on no medications for these.  For several weeks, he has been having problems with exertional chest pain. It was not severe and would stop as soon as he rested. He has had multiple episodes. Over the last 2 weeks, the symptoms have increased in frequency and intensity. Today, he was at work and had onset of chest pain with exertion. However, it did not resolve when he quickly was doing. It reached a 10/10. It was associated with some shortness of breath and nausea but no vomiting or diaphoresis. When his symptoms did not resolve, he realized he needed help. EMS was called. He was given aspirin totaling 324 mg and sublingual nitroglycerin x2 with improvement in his pain.  In the emergency room, his pain has almost completely resolved, but his ECG is consistent with acute ischemia and the Cath Lab was activated. He was taken directly to the Cath Lab.   Past Medical History  Diagnosis Date  . Hypertension   . High cholesterol     Surgical History:  Past Surgical History  Procedure Laterality Date  . Back surgery    . Hip surgery Left   . Total ankle replacement Left   . Shoulder surgery Right   . Knee arthroscopy Left      I have reviewed the patient's current medications. Prior to Admission medications   No prescriptions, occasional BC powders   Scheduled Meds: . [facility] HOLD] heparin  4,000 Units Intravenous STAT   Continuous Infusions: . sodium chloride     Allergies:  Allergies  Allergen Reactions  . Cortisone     History   Social History  .  Marital Status: Divorced    Spouse Name: N/A    Number of Children: N/A  . Years of Education: N/A   Occupational History  . Architect    Social History Main Topics  . Smoking status: Current Every Day Smoker -- 2.00 packs/day for 30 years    Types: Cigarettes  . Smokeless tobacco: Never Used  . Alcohol Use: Yes     Comment: occassional  . Drug Use: No  . Sexual Activity: Not on file   Other Topics Concern  . Not on file   Social History Narrative   Patient lives alone.    History reviewed. No pertinent family history. Family Status  Relation Status Death Age  . Mother Alive     history of bypass surgery  . Father Deceased     history of CHF    Review of Systems:   Full 14-point review of systems otherwise negative except as noted above.  Physical Exam: Blood pressure 187/87, pulse 96, temperature 98.7 F (37.1 C), temperature source Oral, resp. rate 27, height 6' (1.829 m), weight 350 lb (158.759 kg), SpO2 97.00%. General: Well developed, well nourished,male in mild distress. Head: Normocephalic, atraumatic, sclera non-icteric, no xanthomas, nares are without discharge. Dentition: good Neck: No carotid bruits. JVD not elevated. No thyromegally Lungs: Good expansion bilaterally. without wheezes or rhonchi.  Heart: Regular rate and rhythm with S1 S2.  No S3 or  S4.  No murmur, no rubs, or gallops appreciated. Abdomen: Soft, non-tender, non-distended with normoactive bowel sounds. No hepatomegaly. No rebound/guarding. No obvious abdominal masses. Msk:  Strength and tone appear normal for age. No joint deformities or effusions, no spine or costo-vertebral angle tenderness. Extremities: No clubbing or cyanosis. No edema.  Distal pedal pulses are 2+ in 4 extrem Neuro: Alert and oriented X 3. Moves all extremities spontaneously. No focal deficits noted. Psych:  Responds to questions appropriately with a normal affect. Skin: No rashes or lesions noted  Labs:   Lab  Results  Component Value Date   WBC 8.6 04/03/2014   HGB 16.9 04/03/2014   HCT 48.3 04/03/2014   MCV 87.7 04/03/2014   PLT 206 04/03/2014    Recent Labs  04/03/14 1410  INR 0.98   No results found for this basename: NA, K, CL, CO2, BUN, CREATININE, CALCIUM, LABALBU, PROT, BILITOT, ALKPHOS, ALT, AST, GLUCOSE,  in the last 168 hours   Recent Labs  04/03/14 1417  TROPIPOC 0.37*    Radiology/Studies: No results found.   ECG: 04/03/2014 Sinus rhythm Heart rate 92, QTC 436 Significant ST depression in leads I, II, aVL, aVF, V3-6 with ST elevation in aVR and possibly V1  ASSESSMENT AND PLAN:  Principal Problem:   NSTEMI (non-ST elevation myocardial infarction) - Patient is being transported directly to the Cath Lab with further evaluation and treatment depending on the results. He will be screened for cardiac risk factors and their control. He will be started on medications including a statin, beta blocker, and aspirin.  Active Problems:   Hypertension - follow and treat appropriately    High cholesterol - And Lipitor 80, check LFTs    Tobacco use - cessation encouraged, nicotine patch when necessary   Signed, Lonn Georgia, PA-C 04/03/2014 2:56 PM Beeper [phone]  Patient seen, examined. Available data reviewed. Agree with findings, assessment, and plan as outlined by Rosaria Ferries, PA-C. The patient is a pleasant, morbidly obese 48 year old gentleman with chest pain and diffuse ST segment depression, symptoms consistent with acute coronary syndrome (non-ST elevation infarction). Exam reveals an alert, oriented gentleman in no distress. He reports minimal chest pressure at present. Lungs are clear, heart is regular rate and rhythm without murmur, there is 1+ pretibial edema bilaterally. The abdomen is soft and nontender. EKG shows sinus rhythm with diffuse ST segment depression concerning for inferolateral ischemia versus subendocardial infarct. Plan for emergency cardiac  catheterization and possible PCI. Risks, indications, and alternatives have been reviewed in detail with the patient who agrees to proceed. Emergency implied consent was obtained for the procedure.  Sherren Mocha, M.D. 04/03/2014 3:15 PM

## 2014-04-03 NOTE — ED Notes (Signed)
Pt presents from home via [facility] with c/o chest pain that has been intermittent x2 weeks. Pt reports pain starts when he is exerting himself and begins in lower chest, radiates upwards into shoulders and neck. Pt states today he was using the restroom and these symptoms began and were accompanied with nausea and shortness of breath and  10/10 pain. Pt is alert, oriented and interactive. Pt given 324mg  ASA and 2SL nitro with EMS pt rates pain as 0/10 at this time.

## 2014-04-04 ENCOUNTER — Encounter (HOSPITAL_COMMUNITY): Payer: Self-pay | Admitting: Thoracic Surgery (Cardiothoracic Vascular Surgery)

## 2014-04-04 ENCOUNTER — Inpatient Hospital Stay (HOSPITAL_COMMUNITY): Payer: BC Managed Care – PPO

## 2014-04-04 ENCOUNTER — Encounter (HOSPITAL_COMMUNITY): Payer: BC Managed Care – PPO | Admitting: Anesthesiology

## 2014-04-04 ENCOUNTER — Encounter (HOSPITAL_COMMUNITY)
Admission: EM | Disposition: A | Discharge status: Home / Self Care | Payer: BC Managed Care – PPO | Source: Home / Self Care | Attending: Thoracic Surgery (Cardiothoracic Vascular Surgery)

## 2014-04-04 ENCOUNTER — Inpatient Hospital Stay (HOSPITAL_COMMUNITY): Payer: BC Managed Care – PPO | Admitting: Anesthesiology

## 2014-04-04 DIAGNOSIS — Z951 Presence of aortocoronary bypass graft: Secondary | ICD-10-CM

## 2014-04-04 HISTORY — PX: INTRAOPERATIVE TRANSESOPHAGEAL ECHOCARDIOGRAM: SHX5062

## 2014-04-04 HISTORY — DX: Presence of aortocoronary bypass graft: Z95.1

## 2014-04-04 HISTORY — PX: CORONARY ARTERY BYPASS GRAFT: SHX141

## 2014-04-04 LAB — POCT I-STAT 4, (NA,K, GLUC, HGB,HCT)
GLUCOSE: 94 mg/dL (ref 70–99)
GLUCOSE: 132 mg/dL — AB (ref 70–99)
GLUCOSE: 122 mg/dL — AB (ref 70–99)
Glucose, Bld: 117 mg/dL — ABNORMAL HIGH (ref 70–99)
Glucose, Bld: 106 mg/dL — ABNORMAL HIGH (ref 70–99)
Glucose, Bld: 135 mg/dL — ABNORMAL HIGH (ref 70–99)
HCT: 36 % — ABNORMAL LOW (ref 39.0–52.0)
HCT: 37 % — ABNORMAL LOW (ref 39.0–52.0)
HEMATOCRIT: 45 % (ref 39.0–52.0)
HEMATOCRIT: 45 % (ref 39.0–52.0)
HEMATOCRIT: 38 % — AB (ref 39.0–52.0)
HEMATOCRIT: 40 % (ref 39.0–52.0)
HEMOGLOBIN: 15.3 g/dL (ref 13.0–17.0)
HEMOGLOBIN: 12.2 g/dL — AB (ref 13.0–17.0)
Hemoglobin: 15.3 g/dL (ref 13.0–17.0)
Hemoglobin: 12.9 g/dL — ABNORMAL LOW (ref 13.0–17.0)
Hemoglobin: 12.6 g/dL — ABNORMAL LOW (ref 13.0–17.0)
Hemoglobin: 13.6 g/dL (ref 13.0–17.0)
POTASSIUM: 4.7 meq/L (ref 3.7–5.3)
POTASSIUM: 5.2 meq/L (ref 3.7–5.3)
Potassium: 5.6 mEq/L — ABNORMAL HIGH (ref 3.7–5.3)
Potassium: 6.1 mEq/L — ABNORMAL HIGH (ref 3.7–5.3)
Potassium: 6.4 mEq/L — ABNORMAL HIGH (ref 3.7–5.3)
Potassium: 4.6 mEq/L (ref 3.7–5.3)
SODIUM: 139 meq/L (ref 137–147)
SODIUM: 132 meq/L — AB (ref 137–147)
SODIUM: 138 meq/L (ref 137–147)
Sodium: 136 mEq/L — ABNORMAL LOW (ref 137–147)
Sodium: 135 mEq/L — ABNORMAL LOW (ref 137–147)
Sodium: 138 mEq/L (ref 137–147)

## 2014-04-04 LAB — POCT I-STAT 3, ART BLOOD GAS (G3+)
ACID-BASE DEFICIT: 3 mmol/L — AB (ref 0.0–2.0)
ACID-BASE DEFICIT: 2 mmol/L (ref 0.0–2.0)
Acid-base deficit: 1 mmol/L (ref 0.0–2.0)
Acid-base deficit: 4 mmol/L — ABNORMAL HIGH (ref 0.0–2.0)
Acid-base deficit: 3 mmol/L — ABNORMAL HIGH (ref 0.0–2.0)
BICARBONATE: 24 meq/L (ref 20.0–24.0)
BICARBONATE: 22.5 meq/L (ref 20.0–24.0)
BICARBONATE: 23.9 meq/L (ref 20.0–24.0)
Bicarbonate: 26.4 mEq/L — ABNORMAL HIGH (ref 20.0–24.0)
Bicarbonate: 23.6 mEq/L (ref 20.0–24.0)
O2 SAT: 100 %
O2 SAT: 96 %
O2 SAT: 98 %
O2 Saturation: 98 %
O2 Saturation: 95 %
PCO2 ART: 51.7 mmHg — AB (ref 35.0–45.0)
PCO2 ART: 52.7 mmHg — AB (ref 35.0–45.0)
PCO2 ART: 46.8 mmHg — AB (ref 35.0–45.0)
PH ART: 7.261 — AB (ref 7.350–7.450)
PH ART: 7.313 — AB (ref 7.350–7.450)
PO2 ART: 116 mmHg — AB (ref 80.0–100.0)
Patient temperature: 37.4
TCO2: 28 mmol/L (ref 0–100)
TCO2: 26 mmol/L (ref 0–100)
TCO2: 25 mmol/L (ref 0–100)
TCO2: 24 mmol/L (ref 0–100)
TCO2: 25 mmol/L (ref 0–100)
pCO2 arterial: 43.3 mmHg (ref 35.0–45.0)
pCO2 arterial: 45.7 mmHg — ABNORMAL HIGH (ref 35.0–45.0)
pH, Arterial: 7.316 — ABNORMAL LOW (ref 7.350–7.450)
pH, Arterial: 7.328 — ABNORMAL LOW (ref 7.350–7.450)
pH, Arterial: 7.328 — ABNORMAL LOW (ref 7.350–7.450)
pO2, Arterial: 352 mmHg — ABNORMAL HIGH (ref 80.0–100.0)
pO2, Arterial: 88 mmHg (ref 80.0–100.0)
pO2, Arterial: 116 mmHg — ABNORMAL HIGH (ref 80.0–100.0)
pO2, Arterial: 86 mmHg (ref 80.0–100.0)

## 2014-04-04 LAB — CBC
HCT: 39 % (ref 39.0–52.0)
HCT: 38.1 % — ABNORMAL LOW (ref 39.0–52.0)
HEMATOCRIT: 45 % (ref 39.0–52.0)
HEMOGLOBIN: 15.1 g/dL (ref 13.0–17.0)
HEMOGLOBIN: 12.6 g/dL — AB (ref 13.0–17.0)
Hemoglobin: 13 g/dL (ref 13.0–17.0)
MCH: 29.5 pg (ref 26.0–34.0)
MCH: 29.2 pg (ref 26.0–34.0)
MCH: 28.8 pg (ref 26.0–34.0)
MCHC: 33.6 g/dL (ref 30.0–36.0)
MCHC: 33.3 g/dL (ref 30.0–36.0)
MCHC: 33.1 g/dL (ref 30.0–36.0)
MCV: 88.1 fL (ref 78.0–100.0)
MCV: 87.6 fL (ref 78.0–100.0)
MCV: 87.2 fL (ref 78.0–100.0)
PLATELETS: 163 10*3/uL (ref 150–400)
PLATELETS: 179 10*3/uL (ref 150–400)
Platelets: 208 10*3/uL (ref 150–400)
RBC: 5.11 MIL/uL (ref 4.22–5.81)
RBC: 4.45 MIL/uL (ref 4.22–5.81)
RBC: 4.37 MIL/uL (ref 4.22–5.81)
RDW: 13.9 % (ref 11.5–15.5)
RDW: 13.7 % (ref 11.5–15.5)
RDW: 13.7 % (ref 11.5–15.5)
WBC: 7.2 10*3/uL (ref 4.0–10.5)
WBC: 17.7 10*3/uL — ABNORMAL HIGH (ref 4.0–10.5)
WBC: 16.1 10*3/uL — ABNORMAL HIGH (ref 4.0–10.5)

## 2014-04-04 LAB — HEMOGLOBIN AND HEMATOCRIT, BLOOD
HCT: 37.7 % — ABNORMAL LOW (ref 39.0–52.0)
Hemoglobin: 13.2 g/dL (ref 13.0–17.0)

## 2014-04-04 LAB — LIPID PANEL
CHOLESTEROL: 198 mg/dL (ref 0–200)
HDL: 18 mg/dL — ABNORMAL LOW (ref >39)
LDL Cholesterol: 120 mg/dL — ABNORMAL HIGH (ref 0–99)
TRIGLYCERIDES: 298 mg/dL — AB (ref <150)
Total CHOL/HDL Ratio: 11 RATIO
VLDL: 60 mg/dL — ABNORMAL HIGH (ref 0–40)

## 2014-04-04 LAB — CREATININE, SERUM
CREATININE: 0.88 mg/dL (ref 0.50–1.35)
GFR calc non Af Amer: >90 mL/min (ref >90)

## 2014-04-04 LAB — PROTIME-INR
INR: 1.32 (ref 0.00–1.49)
Prothrombin Time: 16.1 seconds — ABNORMAL HIGH (ref 11.6–15.2)

## 2014-04-04 LAB — BASIC METABOLIC PANEL
BUN: 10 mg/dL (ref 6–23)
CALCIUM: 9.3 mg/dL (ref 8.4–10.5)
CO2: 27 mEq/L (ref 19–32)
Chloride: 102 mEq/L (ref 96–112)
Creatinine, Ser: 0.88 mg/dL (ref 0.50–1.35)
Glucose, Bld: 88 mg/dL (ref 70–99)
Potassium: 4.1 mEq/L (ref 3.7–5.3)
SODIUM: 140 meq/L (ref 137–147)

## 2014-04-04 LAB — POCT I-STAT, CHEM 8
BUN: 10 mg/dL (ref 6–23)
CALCIUM ION: 1.24 mmol/L — AB (ref 1.12–1.23)
Chloride: 101 mEq/L (ref 96–112)
Creatinine, Ser: 1 mg/dL (ref 0.50–1.35)
Glucose, Bld: 129 mg/dL — ABNORMAL HIGH (ref 70–99)
HEMATOCRIT: 38 % — AB (ref 39.0–52.0)
HEMOGLOBIN: 12.9 g/dL — AB (ref 13.0–17.0)
Potassium: 4.8 mEq/L (ref 3.7–5.3)
Sodium: 141 mEq/L (ref 137–147)
TCO2: 23 mmol/L (ref 0–100)

## 2014-04-04 LAB — TROPONIN I
TROPONIN I: 3.28 ng/mL — AB (ref <0.30)
Troponin I: 3.96 ng/mL (ref <0.30)

## 2014-04-04 LAB — PLATELET COUNT: Platelets: 212 10*3/uL (ref 150–400)

## 2014-04-04 LAB — APTT: APTT: 31 s (ref 24–37)

## 2014-04-04 LAB — MAGNESIUM: Magnesium: 2.7 mg/dL — ABNORMAL HIGH (ref 1.5–2.5)

## 2014-04-04 SURGERY — CORONARY ARTERY BYPASS GRAFTING (CABG)
Anesthesia: General | Site: Chest

## 2014-04-04 MED ORDER — FENTANYL CITRATE 0.05 MG/ML IJ SOLN
INTRAMUSCULAR | Status: AC
  Filled 2014-04-04: qty 5

## 2014-04-04 MED ORDER — ARTIFICIAL TEARS OP OINT
TOPICAL_OINTMENT | OPHTHALMIC | Status: DC | PRN
  Administered 2014-04-04: 1 via OPHTHALMIC

## 2014-04-04 MED ORDER — FAMOTIDINE IN NACL 20-0.9 MG/50ML-% IV SOLN
20.0000 mg | Freq: Two times a day (BID) | INTRAVENOUS | Status: AC
  Administered 2014-04-04: 20 mg via INTRAVENOUS

## 2014-04-04 MED ORDER — DEXTROSE 5 % IV SOLN
1.5000 g | INTRAVENOUS | Status: DC | PRN
  Administered 2014-04-04: .75 g via INTRAVENOUS
  Administered 2014-04-04: 1.5 g via INTRAVENOUS

## 2014-04-04 MED ORDER — SODIUM CHLORIDE 0.45 % IV SOLN
INTRAVENOUS | Status: DC
  Administered 2014-04-04: 20 mL/h via INTRAVENOUS

## 2014-04-04 MED ORDER — ALBUMIN HUMAN 5 % IV SOLN
INTRAVENOUS | Status: DC | PRN
  Administered 2014-04-04 (×2): via INTRAVENOUS

## 2014-04-04 MED ORDER — EPHEDRINE SULFATE 50 MG/ML IJ SOLN
INTRAMUSCULAR | Status: DC | PRN
  Administered 2014-04-04: 5 mg via INTRAVENOUS

## 2014-04-04 MED ORDER — SODIUM CHLORIDE 0.9 % IJ SOLN
OROMUCOSAL | Status: DC | PRN
  Administered 2014-04-04: 11:00:00 via TOPICAL

## 2014-04-04 MED ORDER — NITROGLYCERIN IN D5W 200-5 MCG/ML-% IV SOLN
INTRAVENOUS | Status: DC | PRN
  Administered 2014-04-04: 10 ug/min via INTRAVENOUS

## 2014-04-04 MED ORDER — LACTATED RINGERS IV SOLN
INTRAVENOUS | Status: DC | PRN
  Administered 2014-04-04: 08:00:00 via INTRAVENOUS

## 2014-04-04 MED ORDER — PLASMA-LYTE 148 IV SOLN
INTRAVENOUS | Status: AC
  Administered 2014-04-04: 11:00:00
  Filled 2014-04-04: qty 2.5

## 2014-04-04 MED ORDER — SODIUM CHLORIDE 0.9 % IJ SOLN
INTRAMUSCULAR | Status: AC
  Filled 2014-04-04: qty 10

## 2014-04-04 MED ORDER — DEXMEDETOMIDINE HCL IN NACL 200 MCG/50ML IV SOLN
INTRAVENOUS | Status: AC
  Filled 2014-04-04: qty 50

## 2014-04-04 MED ORDER — ASPIRIN EC 325 MG PO TBEC
325.0000 mg | DELAYED_RELEASE_TABLET | Freq: Every day | ORAL | Status: DC
Start: 2014-04-05 — End: 2014-04-09
  Administered 2014-04-05 – 2014-04-09 (×5): 325 mg via ORAL
  Filled 2014-04-04 (×5): qty 1

## 2014-04-04 MED ORDER — MIDAZOLAM HCL 2 MG/2ML IJ SOLN: 2.0000 mg | INTRAMUSCULAR | Status: DC | PRN

## 2014-04-04 MED ORDER — LACTATED RINGERS IV SOLN: INTRAVENOUS | Status: DC

## 2014-04-04 MED ORDER — SODIUM CHLORIDE 0.9 % IV SOLN: 250.0000 mL | INTRAVENOUS | Status: AC

## 2014-04-04 MED ORDER — METOPROLOL TARTRATE 1 MG/ML IV SOLN
2.5000 mg | INTRAVENOUS | Status: DC | PRN
  Administered 2014-04-05: 5 mg via INTRAVENOUS
  Filled 2014-04-04: qty 5

## 2014-04-04 MED ORDER — ARTIFICIAL TEARS OP OINT
TOPICAL_OINTMENT | OPHTHALMIC | Status: AC
  Filled 2014-04-04: qty 3.5

## 2014-04-04 MED ORDER — VANCOMYCIN HCL 1000 MG IV SOLR
1000.0000 mg | INTRAVENOUS | Status: DC | PRN
  Administered 2014-04-04: 1500 mg via INTRAVENOUS

## 2014-04-04 MED ORDER — PANTOPRAZOLE SODIUM 40 MG PO TBEC
40.0000 mg | DELAYED_RELEASE_TABLET | Freq: Every day | ORAL | Status: DC
  Administered 2014-04-06 – 2014-04-08 (×3): 40 mg via ORAL
  Filled 2014-04-04 (×2): qty 1

## 2014-04-04 MED ORDER — DEXTROSE 5 % IV SOLN
0.0000 ug/min | INTRAVENOUS | Status: DC
  Filled 2014-04-04 (×2): qty 2

## 2014-04-04 MED ORDER — ROCURONIUM BROMIDE 50 MG/5ML IV SOLN
INTRAVENOUS | Status: AC
  Filled 2014-04-04: qty 2

## 2014-04-04 MED ORDER — FENTANYL CITRATE 0.05 MG/ML IJ SOLN
INTRAMUSCULAR | Status: DC | PRN
Start: 2014-04-04 — End: 2014-04-04
  Administered 2014-04-04: 100 ug via INTRAVENOUS
  Administered 2014-04-04: 50 ug via INTRAVENOUS
  Administered 2014-04-04: 100 ug via INTRAVENOUS
  Administered 2014-04-04: 50 ug via INTRAVENOUS
  Administered 2014-04-04: 100 ug via INTRAVENOUS
  Administered 2014-04-04: 300 ug via INTRAVENOUS
  Administered 2014-04-04: 150 ug via INTRAVENOUS
  Administered 2014-04-04: 50 ug via INTRAVENOUS
  Administered 2014-04-04: 200 ug via INTRAVENOUS

## 2014-04-04 MED ORDER — MORPHINE SULFATE 2 MG/ML IJ SOLN
2.0000 mg | INTRAMUSCULAR | Status: DC | PRN
  Administered 2014-04-05: 4 mg via INTRAVENOUS
  Filled 2014-04-04: qty 2
  Filled 2014-04-04: qty 1

## 2014-04-04 MED ORDER — LACTATED RINGERS IV SOLN
INTRAVENOUS | Status: DC | PRN
  Administered 2014-04-04 (×2): via INTRAVENOUS

## 2014-04-04 MED ORDER — VANCOMYCIN HCL IN DEXTROSE 1-5 GM/200ML-% IV SOLN
1000.0000 mg | Freq: Once | INTRAVENOUS | Status: AC
  Administered 2014-04-04: 1000 mg via INTRAVENOUS
  Filled 2014-04-04: qty 200

## 2014-04-04 MED ORDER — SODIUM CHLORIDE 0.9 % IV SOLN
INTRAVENOUS | Status: DC
  Administered 2014-04-05: 07:00:00 via INTRAVENOUS
  Filled 2014-04-04 (×2): qty 1

## 2014-04-04 MED ORDER — HEPARIN SODIUM (PORCINE) 1000 UNIT/ML IJ SOLN
INTRAMUSCULAR | Status: DC | PRN
  Administered 2014-04-04: 55000 [IU] via INTRAVENOUS

## 2014-04-04 MED ORDER — MIDAZOLAM HCL 10 MG/2ML IJ SOLN
INTRAMUSCULAR | Status: AC
  Filled 2014-04-04: qty 2

## 2014-04-04 MED ORDER — PROPOFOL 10 MG/ML IV BOLUS
INTRAVENOUS | Status: DC | PRN
  Administered 2014-04-04: 90 mg via INTRAVENOUS

## 2014-04-04 MED ORDER — MILRINONE IN DEXTROSE 20 MG/100ML IV SOLN
0.3000 ug/kg/min | INTRAVENOUS | Status: DC
  Administered 2014-04-04 – 2014-04-05 (×2): 0.3 ug/kg/min via INTRAVENOUS
  Filled 2014-04-04 (×3): qty 100

## 2014-04-04 MED ORDER — OXYCODONE HCL 5 MG PO TABS
5.0000 mg | ORAL_TABLET | ORAL | Status: DC | PRN
  Administered 2014-04-05: 10 mg via ORAL
  Administered 2014-04-05: 5 mg via ORAL
  Administered 2014-04-05 – 2014-04-06 (×5): 10 mg via ORAL
  Filled 2014-04-04: qty 2
  Filled 2014-04-04: qty 1
  Filled 2014-04-04 (×5): qty 2

## 2014-04-04 MED ORDER — INSULIN REGULAR BOLUS VIA INFUSION
0.0000 [IU] | Freq: Three times a day (TID) | INTRAVENOUS | Status: DC
  Administered 2014-04-05: 3 [IU] via INTRAVENOUS
  Filled 2014-04-04: qty 10

## 2014-04-04 MED ORDER — DEXMEDETOMIDINE HCL IN NACL 200 MCG/50ML IV SOLN
INTRAVENOUS | Status: DC | PRN
  Administered 2014-04-04: 0.2 ug/kg/h via INTRAVENOUS

## 2014-04-04 MED ORDER — VANCOMYCIN HCL 1000 MG IV SOLR
INTRAVENOUS | Status: AC
  Administered 2014-04-04: 11:00:00
  Filled 2014-04-04: qty 1000

## 2014-04-04 MED ORDER — PROPOFOL 10 MG/ML IV BOLUS
INTRAVENOUS | Status: AC
  Filled 2014-04-04: qty 20

## 2014-04-04 MED ORDER — MIDAZOLAM HCL 2 MG/2ML IJ SOLN
INTRAMUSCULAR | Status: AC
  Filled 2014-04-04: qty 2

## 2014-04-04 MED ORDER — BISACODYL 10 MG RE SUPP: 10.0000 mg | Freq: Every day | RECTAL | Status: DC

## 2014-04-04 MED ORDER — MAGNESIUM SULFATE 4000MG/100ML IJ SOLN
4.0000 g | Freq: Once | INTRAMUSCULAR | Status: AC
  Administered 2014-04-04: 4 g via INTRAVENOUS

## 2014-04-04 MED ORDER — SODIUM CHLORIDE 0.9 % IV SOLN
100.0000 [IU] | INTRAVENOUS | Status: DC | PRN
  Administered 2014-04-04: 1 [IU]/h via INTRAVENOUS

## 2014-04-04 MED ORDER — SODIUM CHLORIDE 0.9 % IV SOLN: INTRAVENOUS | Status: DC

## 2014-04-04 MED ORDER — PROTAMINE SULFATE 10 MG/ML IV SOLN
INTRAVENOUS | Status: DC | PRN
  Administered 2014-04-04: 190 mg via INTRAVENOUS
  Administered 2014-04-04: 10 mg via INTRAVENOUS
  Administered 2014-04-04 (×4): 50 mg via INTRAVENOUS

## 2014-04-04 MED ORDER — POTASSIUM CHLORIDE 10 MEQ/50ML IV SOLN: 10.0000 meq | INTRAVENOUS | Status: AC

## 2014-04-04 MED ORDER — ONDANSETRON HCL 4 MG/2ML IJ SOLN: 4.0000 mg | Freq: Four times a day (QID) | INTRAMUSCULAR | Status: DC | PRN

## 2014-04-04 MED ORDER — PHENYLEPHRINE HCL 10 MG/ML IJ SOLN
20.0000 mg | INTRAVENOUS | Status: DC | PRN
  Administered 2014-04-04: 20 ug/min via INTRAVENOUS

## 2014-04-04 MED ORDER — AMINOCAPROIC ACID 250 MG/ML IV SOLN
10.0000 g | INTRAVENOUS | Status: DC | PRN
  Administered 2014-04-04: 5 g/h via INTRAVENOUS

## 2014-04-04 MED ORDER — SUCCINYLCHOLINE CHLORIDE 20 MG/ML IJ SOLN
INTRAMUSCULAR | Status: DC | PRN
  Administered 2014-04-04: 120 mg via INTRAVENOUS

## 2014-04-04 MED ORDER — DEXTROSE 5 % IV SOLN
1.5000 g | Freq: Two times a day (BID) | INTRAVENOUS | Status: AC
  Administered 2014-04-04 – 2014-04-06 (×4): 1.5 g via INTRAVENOUS
  Filled 2014-04-04 (×4): qty 1.5

## 2014-04-04 MED ORDER — SODIUM CHLORIDE 0.9 % IV SOLN
INTRAVENOUS | Status: DC | PRN
  Administered 2014-04-04: 13:00:00 via INTRAVENOUS

## 2014-04-04 MED ORDER — LACTATED RINGERS IV SOLN: 500.0000 mL | Freq: Once | INTRAVENOUS | Status: AC | PRN

## 2014-04-04 MED ORDER — SODIUM CHLORIDE 0.9 % IJ SOLN: 3.0000 mL | INTRAMUSCULAR | Status: DC | PRN

## 2014-04-04 MED ORDER — ROCURONIUM BROMIDE 100 MG/10ML IV SOLN
INTRAVENOUS | Status: DC | PRN
Start: 2014-04-04 — End: 2014-04-04
  Administered 2014-04-04: 50 mg via INTRAVENOUS
  Administered 2014-04-04: 40 mg via INTRAVENOUS
  Administered 2014-04-04 (×3): 50 mg via INTRAVENOUS
  Administered 2014-04-04: 60 mg via INTRAVENOUS

## 2014-04-04 MED ORDER — HEPARIN SODIUM (PORCINE) 1000 UNIT/ML IJ SOLN
INTRAMUSCULAR | Status: AC
  Filled 2014-04-04: qty 1

## 2014-04-04 MED ORDER — BISACODYL 5 MG PO TBEC
10.0000 mg | DELAYED_RELEASE_TABLET | Freq: Every day | ORAL | Status: DC
  Administered 2014-04-05 – 2014-04-06 (×2): 10 mg via ORAL
  Filled 2014-04-04: qty 2

## 2014-04-04 MED ORDER — HEPARIN SODIUM (PORCINE) 1000 UNIT/ML IJ SOLN
INTRAMUSCULAR | Status: AC
  Filled 2014-04-04: qty 3

## 2014-04-04 MED ORDER — ACETAMINOPHEN 160 MG/5ML PO SOLN: 1000.0000 mg | Freq: Four times a day (QID) | ORAL | Status: DC

## 2014-04-04 MED ORDER — MILRINONE IN DEXTROSE 20 MG/100ML IV SOLN
0.1250 ug/kg/min | INTRAVENOUS | Status: AC
  Administered 2014-04-04: .3 ug/kg/min via INTRAVENOUS
  Filled 2014-04-04: qty 100

## 2014-04-04 MED ORDER — 0.9 % SODIUM CHLORIDE (POUR BTL) OPTIME
TOPICAL | Status: DC | PRN
  Administered 2014-04-04: 5000 mL

## 2014-04-04 MED ORDER — ALBUMIN HUMAN 5 % IV SOLN
250.0000 mL | INTRAVENOUS | Status: AC | PRN
  Administered 2014-04-04 (×3): 250 mL via INTRAVENOUS
  Filled 2014-04-04: qty 250

## 2014-04-04 MED ORDER — METOPROLOL TARTRATE 12.5 MG HALF TABLET
12.5000 mg | ORAL_TABLET | Freq: Two times a day (BID) | ORAL | Status: DC
  Filled 2014-04-04 (×3): qty 1

## 2014-04-04 MED ORDER — ROCURONIUM BROMIDE 50 MG/5ML IV SOLN
INTRAVENOUS | Status: AC
  Filled 2014-04-04: qty 1

## 2014-04-04 MED ORDER — EPHEDRINE SULFATE 50 MG/ML IJ SOLN
INTRAMUSCULAR | Status: AC
  Filled 2014-04-04: qty 1

## 2014-04-04 MED ORDER — MAGNESIUM SULFATE 40 MG/ML IJ SOLN
INTRAMUSCULAR | Status: AC
  Filled 2014-04-04: qty 100

## 2014-04-04 MED ORDER — ACETAMINOPHEN 650 MG RE SUPP
650.0000 mg | Freq: Once | RECTAL | Status: AC
  Administered 2014-04-04: 650 mg via RECTAL

## 2014-04-04 MED ORDER — ACETAMINOPHEN 160 MG/5ML PO SOLN: 650.0000 mg | Freq: Once | ORAL | Status: AC

## 2014-04-04 MED ORDER — ALBUTEROL SULFATE HFA 108 (90 BASE) MCG/ACT IN AERS
INHALATION_SPRAY | RESPIRATORY_TRACT | Status: DC | PRN
  Administered 2014-04-04: 6 via RESPIRATORY_TRACT

## 2014-04-04 MED ORDER — NITROGLYCERIN IN D5W 200-5 MCG/ML-% IV SOLN: 0.0000 ug/min | INTRAVENOUS | Status: DC

## 2014-04-04 MED ORDER — MIDAZOLAM HCL 5 MG/5ML IJ SOLN
INTRAMUSCULAR | Status: DC | PRN
  Administered 2014-04-04: 3 mg via INTRAVENOUS
  Administered 2014-04-04: 1 mg via INTRAVENOUS
  Administered 2014-04-04: 2 mg via INTRAVENOUS
  Administered 2014-04-04: 7 mg via INTRAVENOUS
  Administered 2014-04-04: 3 mg via INTRAVENOUS
  Administered 2014-04-04 (×2): 2 mg via INTRAVENOUS

## 2014-04-04 MED ORDER — DOCUSATE SODIUM 100 MG PO CAPS
200.0000 mg | ORAL_CAPSULE | Freq: Every day | ORAL | Status: DC
  Administered 2014-04-05 – 2014-04-06 (×2): 200 mg via ORAL
  Filled 2014-04-04 (×4): qty 2

## 2014-04-04 MED ORDER — LIDOCAINE HCL 4 % MT SOLN
OROMUCOSAL | Status: DC | PRN
  Administered 2014-04-04: 4 mL via TOPICAL

## 2014-04-04 MED ORDER — METOPROLOL TARTRATE 25 MG/10 ML ORAL SUSPENSION
12.5000 mg | Freq: Two times a day (BID) | ORAL | Status: DC
  Filled 2014-04-04 (×3): qty 5

## 2014-04-04 MED ORDER — ASPIRIN 81 MG PO CHEW: 324.0000 mg | CHEWABLE_TABLET | Freq: Every day | ORAL | Status: DC

## 2014-04-04 MED ORDER — SUCCINYLCHOLINE CHLORIDE 20 MG/ML IJ SOLN
INTRAMUSCULAR | Status: AC
  Filled 2014-04-04: qty 1

## 2014-04-04 MED ORDER — DEXMEDETOMIDINE HCL IN NACL 200 MCG/50ML IV SOLN
0.1000 ug/kg/h | INTRAVENOUS | Status: DC
  Administered 2014-04-04: 0.1 ug/kg/h via INTRAVENOUS
  Filled 2014-04-04: qty 50

## 2014-04-04 MED ORDER — ACETAMINOPHEN 500 MG PO TABS
1000.0000 mg | ORAL_TABLET | Freq: Four times a day (QID) | ORAL | Status: DC
  Administered 2014-04-04 – 2014-04-09 (×18): 1000 mg via ORAL
  Filled 2014-04-04 (×21): qty 2

## 2014-04-04 MED ORDER — MORPHINE SULFATE 2 MG/ML IJ SOLN
1.0000 mg | INTRAMUSCULAR | Status: AC | PRN
  Administered 2014-04-04 (×2): 2 mg via INTRAVENOUS
  Filled 2014-04-04: qty 1

## 2014-04-04 MED ORDER — SODIUM CHLORIDE 0.9 % IJ SOLN
3.0000 mL | Freq: Two times a day (BID) | INTRAMUSCULAR | Status: DC
  Administered 2014-04-05 – 2014-04-08 (×4): 3 mL via INTRAVENOUS

## 2014-04-04 SURGICAL SUPPLY — 139 items
ADAPTER CARDIO PERF ANTE/RETRO (ADAPTER) ×2 IMPLANT
ADPR PRFSN 84XANTGRD RTRGD (ADAPTER) ×3
APL SKNCLS STERI-STRIP NONHPOA (GAUZE/BANDAGES/DRESSINGS)
APPLIER CLIP 9.375 MED OPEN (MISCELLANEOUS)
APPLIER CLIP 9.375 SM OPEN (CLIP)
APR CLP MED 9.3 20 MLT OPN (MISCELLANEOUS)
APR CLP SM 9.3 20 MLT OPN (CLIP)
ATTRACTOMAT 16X20 MAGNETIC DRP (DRAPES) ×4 IMPLANT
BAG DECANTER FOR FLEXI CONT (MISCELLANEOUS) ×4 IMPLANT
BANDAGE ELASTIC 4 VELCRO ST LF (GAUZE/BANDAGES/DRESSINGS) ×6 IMPLANT
BANDAGE ELASTIC 6 VELCRO ST LF (GAUZE/BANDAGES/DRESSINGS) ×4 IMPLANT
BANDAGE GAUZE ELAST BULKY 4 IN (GAUZE/BANDAGES/DRESSINGS) ×6 IMPLANT
BASKET HEART (ORDER IN 25'S) (MISCELLANEOUS) ×1
BASKET HEART (ORDER IN 25S) (MISCELLANEOUS) ×3 IMPLANT
BENZOIN TINCTURE PRP APPL 2/3 (GAUZE/BANDAGES/DRESSINGS) ×2 IMPLANT
BLADE STERNUM SYSTEM 6 (BLADE) ×4 IMPLANT
BLADE SURG 11 STRL SS (BLADE) ×2 IMPLANT
BLADE SURG 15 STRL LF DISP TIS (BLADE) IMPLANT
BLADE SURG 15 STRL SS (BLADE)
BLADE SURG ROTATE 9660 (MISCELLANEOUS) IMPLANT
CANISTER SUCTION 2500CC (MISCELLANEOUS) ×4 IMPLANT
CANNULA EZ GLIDE 8.0 24FR (CANNULA) ×2 IMPLANT
CANNULA EZ GLIDE AORTIC 21FR (CANNULA) ×6 IMPLANT
CANNULA GUNDRY RCSP 15FR (MISCELLANEOUS) ×2 IMPLANT
CANNULA VENOUS LOW PROF 34X46 (CANNULA) ×2 IMPLANT
CARDIAC SUCTION (MISCELLANEOUS) ×4 IMPLANT
CATH CPB KIT OWEN (MISCELLANEOUS) ×4 IMPLANT
CATH THORACIC 28FR (CATHETERS) IMPLANT
CATH THORACIC 28FR RT ANG (CATHETERS) IMPLANT
CATH THORACIC 36FR (CATHETERS) ×4 IMPLANT
CATH THORACIC 36FR RT ANG (CATHETERS) ×4 IMPLANT
CLIP APPLIE 9.375 MED OPEN (MISCELLANEOUS) IMPLANT
CLIP APPLIE 9.375 SM OPEN (CLIP) IMPLANT
CLIP FOGARTY SPRING 6M (CLIP) IMPLANT
CLIP RETRACTION 3.0MM CORONARY (MISCELLANEOUS) ×4 IMPLANT
CLIP TI MEDIUM 24 (CLIP) IMPLANT
CLIP TI WIDE RED SMALL 24 (CLIP) IMPLANT
CONN Y 3/8X3/8X3/8  BEN (MISCELLANEOUS)
CONN Y 3/8X3/8X3/8 BEN (MISCELLANEOUS) IMPLANT
COVER MAYO STAND STRL (DRAPES) ×2 IMPLANT
COVER SURGICAL LIGHT HANDLE (MISCELLANEOUS) ×4 IMPLANT
CRADLE DONUT ADULT HEAD (MISCELLANEOUS) ×4 IMPLANT
DRAIN CHANNEL 32F RND 10.7 FF (WOUND CARE) ×6 IMPLANT
DRAIN HEMOVAC 1/8 X 5 (WOUND CARE) ×2 IMPLANT
DRAPE CARDIOVASCULAR INCISE (DRAPES) ×4
DRAPE EXTREMITY T 121X128X90 (DRAPE) ×2 IMPLANT
DRAPE INCISE IOBAN 66X45 STRL (DRAPES) ×4 IMPLANT
DRAPE PROXIMA HALF (DRAPES) ×2 IMPLANT
DRAPE SLUSH/WARMER DISC (DRAPES) ×4 IMPLANT
DRAPE SRG 135X102X78XABS (DRAPES) ×3 IMPLANT
DRSG AQUACEL AG ADV 3.5X14 (GAUZE/BANDAGES/DRESSINGS) ×2 IMPLANT
DRSG COVADERM 4X14 (GAUZE/BANDAGES/DRESSINGS) ×4 IMPLANT
ELECT BLADE 4.0 EZ CLEAN MEGAD (MISCELLANEOUS) ×4
ELECT REM PT RETURN 9FT ADLT (ELECTROSURGICAL) ×8
ELECTRODE BLDE 4.0 EZ CLN MEGD (MISCELLANEOUS) ×1 IMPLANT
ELECTRODE REM PT RTRN 9FT ADLT (ELECTROSURGICAL) ×6 IMPLANT
EVACUATOR SILICONE 100CC (DRAIN) ×2 IMPLANT
GEL ULTRASOUND 20GR AQUASONIC (MISCELLANEOUS) ×2 IMPLANT
GLOVE BIO SURGEON STRL SZ 6 (GLOVE) ×16 IMPLANT
GLOVE BIO SURGEON STRL SZ 6.5 (GLOVE) ×8 IMPLANT
GLOVE BIO SURGEON STRL SZ7 (GLOVE) IMPLANT
GLOVE BIO SURGEON STRL SZ7.5 (GLOVE) IMPLANT
GLOVE BIOGEL PI IND STRL 6 (GLOVE) IMPLANT
GLOVE BIOGEL PI IND STRL 6.5 (GLOVE) ×4 IMPLANT
GLOVE BIOGEL PI IND STRL 7.0 (GLOVE) ×5 IMPLANT
GLOVE BIOGEL PI INDICATOR 6 (GLOVE)
GLOVE BIOGEL PI INDICATOR 6.5 (GLOVE) ×4
GLOVE BIOGEL PI INDICATOR 7.0 (GLOVE) ×5
GLOVE EUDERMIC 7 POWDERFREE (GLOVE) IMPLANT
GLOVE ORTHO TXT STRL SZ7.5 (GLOVE) ×10 IMPLANT
GOWN STRL REUS W/ TWL LRG LVL3 (GOWN DISPOSABLE) ×16 IMPLANT
GOWN STRL REUS W/TWL LRG LVL3 (GOWN DISPOSABLE) ×32
HARMONIC SHEARS 14CM COAG (MISCELLANEOUS) ×4 IMPLANT
HEMOSTAT POWDER SURGIFOAM 1G (HEMOSTASIS) ×16 IMPLANT
INSERT FOGARTY 61MM (MISCELLANEOUS) IMPLANT
INSERT FOGARTY XLG (MISCELLANEOUS) ×4 IMPLANT
KIT BASIN OR (CUSTOM PROCEDURE TRAY) ×4 IMPLANT
KIT ROOM TURNOVER OR (KITS) ×4 IMPLANT
KIT SUCTION CATH 14FR (SUCTIONS) ×18 IMPLANT
KIT VASOVIEW W/TROCAR VH 2000 (KITS) ×4 IMPLANT
LEAD PACING MYOCARDI (MISCELLANEOUS) ×4 IMPLANT
MARKER GRAFT CORONARY BYPASS (MISCELLANEOUS) ×12 IMPLANT
NS IRRIG 1000ML POUR BTL (IV SOLUTION) ×22 IMPLANT
PACK OPEN HEART (CUSTOM PROCEDURE TRAY) ×4 IMPLANT
PAD ARMBOARD 7.5X6 YLW CONV (MISCELLANEOUS) ×8 IMPLANT
PAD ELECT DEFIB RADIOL ZOLL (MISCELLANEOUS) ×4 IMPLANT
PENCIL BUTTON HOLSTER BLD 10FT (ELECTRODE) ×4 IMPLANT
PUNCH AORTIC ROTATE 4.0MM (MISCELLANEOUS) IMPLANT
PUNCH AORTIC ROTATE 4.5MM 8IN (MISCELLANEOUS) ×2 IMPLANT
PUNCH AORTIC ROTATE 5MM 8IN (MISCELLANEOUS) IMPLANT
SENSOR MYOCARDIAL TEMP (MISCELLANEOUS) ×2 IMPLANT
SET CARDIOPLEGIA MPS 5001102 (MISCELLANEOUS) ×2 IMPLANT
SOLUTION ANTI FOG 6CC (MISCELLANEOUS) ×2 IMPLANT
SPONGE GAUZE 4X4 12PLY (GAUZE/BANDAGES/DRESSINGS) ×4 IMPLANT
SPONGE GAUZE 4X4 12PLY STER LF (GAUZE/BANDAGES/DRESSINGS) ×4 IMPLANT
SPONGE LAP 18X18 X RAY DECT (DISPOSABLE) ×4 IMPLANT
SPONGE LAP 4X18 X RAY DECT (DISPOSABLE) ×2 IMPLANT
SUT BONE WAX W31G (SUTURE) ×4 IMPLANT
SUT ETHIBOND X763 2 0 SH 1 (SUTURE) ×8 IMPLANT
SUT MNCRL AB 3-0 PS2 18 (SUTURE) ×8 IMPLANT
SUT MNCRL AB 4-0 PS2 18 (SUTURE) ×2 IMPLANT
SUT PDS AB 1 CTX 36 (SUTURE) ×8 IMPLANT
SUT PROLENE 2 0 SH DA (SUTURE) IMPLANT
SUT PROLENE 3 0 SH DA (SUTURE) ×4 IMPLANT
SUT PROLENE 3 0 SH1 36 (SUTURE) IMPLANT
SUT PROLENE 4 0 RB 1 (SUTURE)
SUT PROLENE 4 0 SH DA (SUTURE) ×2 IMPLANT
SUT PROLENE 4-0 RB1 .5 CRCL 36 (SUTURE) IMPLANT
SUT PROLENE 5 0 C 1 36 (SUTURE) IMPLANT
SUT PROLENE 6 0 C 1 30 (SUTURE) ×4 IMPLANT
SUT PROLENE 7.0 RB 3 (SUTURE) ×22 IMPLANT
SUT PROLENE 8 0 BV175 6 (SUTURE) ×4 IMPLANT
SUT PROLENE BLUE 7 0 (SUTURE) ×4 IMPLANT
SUT PROLENE POLY MONO (SUTURE) IMPLANT
SUT SILK  1 MH (SUTURE) ×1
SUT SILK 1 MH (SUTURE) ×3 IMPLANT
SUT STEEL 6MS V (SUTURE) IMPLANT
SUT STEEL STERNAL CCS#1 18IN (SUTURE) ×2 IMPLANT
SUT STEEL SZ 6 DBL 3X14 BALL (SUTURE) ×4 IMPLANT
SUT VIC AB 1 CTX 18 (SUTURE) ×4 IMPLANT
SUT VIC AB 1 CTX 36 (SUTURE) ×8
SUT VIC AB 1 CTX36XBRD ANBCTR (SUTURE) ×2 IMPLANT
SUT VIC AB 2-0 CT1 27 (SUTURE) ×4
SUT VIC AB 2-0 CT1 TAPERPNT 27 (SUTURE) ×1 IMPLANT
SUT VIC AB 2-0 CTX 27 (SUTURE) IMPLANT
SUT VIC AB 3-0 SH 27 (SUTURE)
SUT VIC AB 3-0 SH 27X BRD (SUTURE) IMPLANT
SUT VIC AB 3-0 X1 27 (SUTURE) IMPLANT
SUT VICRYL 4-0 PS2 18IN ABS (SUTURE) IMPLANT
SUTURE E-PAK OPEN HEART (SUTURE) ×4 IMPLANT
SYR 50ML SLIP (SYRINGE) ×2 IMPLANT
SYSTEM SAHARA CHEST DRAIN ATS (WOUND CARE) ×4 IMPLANT
TAPE CLOTH SURG 4X10 WHT LF (GAUZE/BANDAGES/DRESSINGS) ×2 IMPLANT
TOWEL OR 17X24 6PK STRL BLUE (TOWEL DISPOSABLE) ×8 IMPLANT
TOWEL OR 17X26 10 PK STRL BLUE (TOWEL DISPOSABLE) ×8 IMPLANT
TRAY FOLEY IC TEMP SENS 16FR (CATHETERS) ×4 IMPLANT
TUBING INSUFFLATION 10FT LAP (TUBING) ×4 IMPLANT
UNDERPAD 30X30 INCONTINENT (UNDERPADS AND DIAPERS) ×4 IMPLANT
WATER STERILE IRR 1000ML POUR (IV SOLUTION) ×8 IMPLANT

## 2014-04-04 NOTE — Progress Notes (Signed)
  Echocardiogram Echocardiogram Transesophageal has been performed.  [name] 04/04/2014, 9:53 AM

## 2014-04-04 NOTE — Anesthesia Preprocedure Evaluation (Signed)
Anesthesia Evaluation  Patient identified by MRN, date of birth, ID band Patient awake    Reviewed: Allergy & Precautions, H&P , NPO status , Patient's Chart, lab work & pertinent test results, reviewed documented beta blocker date and time   History of Anesthesia Complications Negative for: history of anesthetic complications  Airway       Dental   Pulmonary Current Smoker,          Cardiovascular hypertension, Pt. on medications + CAD and + Past MI     Neuro/Psych    GI/Hepatic   Endo/Other    Renal/GU      Musculoskeletal   Abdominal   Peds  Hematology   Anesthesia Other Findings   Reproductive/Obstetrics                           Anesthesia Physical Anesthesia Plan  ASA: IV  Anesthesia Plan: General   Post-op Pain Management:    Induction: Intravenous  Airway Management Planned:   Additional Equipment: Arterial line, CVP, PA Cath and TEE  Intra-op Plan:   Post-operative Plan: Post-operative intubation/ventilation  Informed Consent: I have reviewed the patients History and Physical, chart, labs and discussed the procedure including the risks, benefits and alternatives for the proposed anesthesia with the patient or authorized representative who has indicated his/her understanding and acceptance.   Dental advisory given  Plan Discussed with: Anesthesiologist and CRNA  Anesthesia Plan Comments:         Anesthesia Quick Evaluation

## 2014-04-04 NOTE — Transfer of Care (Signed)
Immediate Anesthesia Transfer of Care Note  Patient: Fernando Schmidt  Procedure(s) Performed: Procedure(s): Coronary artery bypass graft times three using left internal mammary artery and right leg greater saphenous vein. (N/A) INTRAOPERATIVE TRANSESOPHAGEAL ECHOCARDIOGRAM (N/A)  Patient Location: SICU  Anesthesia Type:General  Level of Consciousness: Patient remains intubated per anesthesia plan  Airway & Oxygen Therapy: Patient remains intubated per anesthesia plan and Patient placed on Ventilator (see vital sign flow sheet for setting)  Post-op Assessment: Report given to PACU RN and Post -op Vital signs reviewed and stable  Post vital signs: Reviewed and stable  Complications: No apparent anesthesia complications

## 2014-04-04 NOTE — Anesthesia Procedure Notes (Addendum)
Procedure Name: Intubation Date/Time: 04/04/2014 8:49 AM Performed by: Trixie Deis A Pre-anesthesia Checklist: Patient identified, Emergency Drugs available, Suction available, Patient being monitored and Timeout performed Patient Re-evaluated:Patient Re-evaluated prior to inductionOxygen Delivery Method: Circle system utilized Preoxygenation: Pre-oxygenation with 100% oxygen Intubation Type: IV induction Ventilation: Oral airway inserted - appropriate to patient size, Two handed mask ventilation required and Mask ventilation with difficulty Grade View: Grade I Tube type: Oral Tube size: 8.0 mm Number of attempts: 1 Airway Equipment and Method: Video-laryngoscopy and Stylet Placement Confirmation: ETT inserted through vocal cords under direct vision,  positive ETCO2 and breath sounds checked- equal and bilateral Secured at: 23 cm Tube secured with: Tape Dental Injury: Teeth and Oropharynx as per pre-operative assessment  Difficulty Due To: Difficulty was anticipated and Difficult Airway- due to large tongue Comments: Patient morbidly obese, has short neck, large tongue, excess edematous tissue, and Beard/mustache.  Difficult mask ventilation required two hands and oral airway.  Limited gas exchange even with these interventions.  Elective glidescope, easy placement of ETT.   Anesthesia Procedure Note Anesthesia Procedure Note Anesthesia Procedure Note  Anesthesia Procedure Note    The patient was identified and consent obtained.  TO was performed, and full barrier precautions were used.  The skin was anesthetized with lidocaine.  Once the vein was located with the 22 ga. needle using ultrasound guidance , the wire was inserted into the vein.  The wire location was confirmed with ultrasound.  The insertion site was dilated and the introducer was carefully inserted and sutured in place. The PAC was checked, and floated into the PA.  Once in the PA, the catheter was secured. The patient  tolerated the procedure well.  CXR was ordered for PACU. Start: [phone] [id number] J. Tedra Senegal, MD

## 2014-04-04 NOTE — Op Note (Signed)
CARDIOTHORACIC SURGERY OPERATIVE NOTE  Date of Procedure: 04/04/2014  Preoperative Diagnosis:   Severe 3-vessel Coronary Artery Disease  S/P Acute Non-STEMI  Postoperative Diagnosis: Same  Procedure:    Coronary Artery Bypass Grafting x 3   Left Internal Mammary Artery to Distal Left Anterior Descending Coronary Artery  Saphenous Vein Graft to Posterior Descending Coronary Artery  Saphenous Vein Graft to Obtuse Marginal Branch of Left Circumflex Coronary Artery  Endoscopic Vein Harvest from Right Thigh and Lower Leg  Surgeon: Valentina Gu. Roxy Manns, MD  Assistant: Ellwood Handler, PA-C and Suzzanne Cloud, PA-C  Anesthesia: Finis Bud, MD  Operative Findings: Mild LV dysfunction with mild inferobasal hypokinesis  Good quality LIMA conduit for grafting  Large caliber but otherwise good quality SVG conduit for grafting  Small caliber LRA - vessel exposed at the wrist but not harvested  Diffuse CAD with poor target vessels for grafting Baseline pulmonary hypertension    BRIEF CLINICAL NOTE AND INDICATIONS FOR SURGERY  Patient is a 48 year old morbidly obese white male with no previous history of coronary artery disease but multiple risk factors including hypertension, hyperlipidemia, long-standing tobacco abuse, and a strong family history of coronary artery disease. The patient reports eating in his usual state of health until approximately 2 or 3 weeks ago when he first began to experience symptoms of exertional substernal chest tightness and shortness of breath. Symptoms initially always resolved with rest but gradually increased in both frequency and intensity. Earlier today the patient developed severe onset of substernal chest tightness while he was at work. Symptoms persisted and became progressively severe and associated with resting shortness of breath. EMS was called and the patient was brought to the emergency department. Intravenous port the patient received sublingual  nitroglycerin and aspirin. By the time the patient arrived his chest discomfort had nearly completely resolved. EKG revealed sinus rhythm with diffuse ST segment depression. The patient was brought directly to the cardiac cath lab by Dr. Burt Knack. Catheterization revealed severe three-vessel coronary artery disease with reasonably well-preserved left ventricular systolic function. Cardiothoracic surgical consultation was requested. By the time the patient left the cath lab and was transported to the cardiac intensive care unit his chest pain had completely resolved. The patient has been seen in consultation and counseled at length regarding the indications, risks and potential benefits of surgery.  All questions have been answered, and the patient provides full informed consent for the operation as described.     DETAILS OF THE OPERATIVE PROCEDURE  Preparation:  The patient is brought to the operating room on the above mentioned date and central monitoring was established by the anesthesia team including placement of Swan-Ganz catheter and radial arterial line. The patient had pulmonary hypertension with PA pressures > 70 mmHg systolic.  The patient is placed in the supine position on the operating table.  Intravenous antibiotics are administered. General endotracheal anesthesia is induced uneventfully. A Foley catheter is placed.  Baseline transesophageal echocardiogram was performed.  Findings were notable for mild hypokinesis of the high inferior and basal portion of the LV with otherwise normal LV function.  There was mild mitral regurgitation.  No other abnormalities were noted.  The patient's chest, abdomen, both groins, and both lower extremities are prepared and draped in a sterile manner. A time out procedure is performed.   Surgical Approach and Conduit Harvest:  A median sternotomy incision was performed and the left internal mammary artery is dissected from the chest wall and prepared for  bypass grafting. The left internal  mammary artery is notably good quality conduit.  A small incision is made on the volar aspect of the left forearm to expose the distal portion of the left radial artery.  The left radial artery is fairly small caliber.  Simultaneously, saphenous vein is obtained from the patient's right thigh and leg using endoscopic vein harvest technique. The saphenous vein is notably large caliber but otherwise good quality conduit.   Because the saphenous vein appeared to be good conduit, the incision in the left forearm was closed without further dissection or harvest of the left radial artery.  After removal of the saphenous vein, the small surgical incisions in the lower extremity are closed with absorbable suture. Following systemic heparinization, the left internal mammary artery was transected distally noted to have excellent flow.   Extracorporeal Cardiopulmonary Bypass and Myocardial Protection:  The pericardium is opened. The ascending aorta is normal in appearance. The ascending aorta and the right atrium are cannulated for cardioplegia bypass.  Adequate heparinization is verified.   A retrograde cardioplegia cannula is placed through the right atrium into the coronary sinus.  The entire pre-bypass portion of the operation was notable for stable hemodynamics.  Cardiopulmonary bypass was begun and the surface of the heart is inspected. Distal target vessels are selected for coronary artery bypass grafting. A cardioplegia cannula is placed in the ascending aorta.  A temperature probe was placed in the interventricular septum.  The patient is allowed to cool passively to [facility] systemic temperature.  The aortic cross clamp is applied and cold blood cardioplegia is delivered initially in an antegrade fashion through the aortic root.   Supplemental cardioplegia is given retrograde through the coronary sinus catheter.  Iced saline slush is applied for topical hypothermia.  The  initial cardioplegic arrest is rapid with early diastolic arrest.  Repeat doses of cardioplegia are administered intermittently throughout the entire cross clamp portion of the operation through the aortic root, through the coronary sinus catheter, and through subsequently placed vein grafts in order to maintain completely flat electrocardiogram and septal myocardial temperature below 15C.  Myocardial protection was felt to be excellent.  Coronary Artery Bypass Grafting:   The posterior descending branch of the right coronary artery was grafted using a reversed saphenous vein graft in an end-to-side fashion.  At the site of distal anastomosis the target vessel was fair quality and measured approximately 1.5 mm in diameter.  The obtuse marginal branch of the left circumflex coronary artery was grafted using a reversed saphenous vein graft in an end-to-side fashion.  At the site of distal anastomosis the target vessel was poor quality and measured approximately 1.3 mm in diameter.  The distal left anterior coronary artery was grafted with the left internal mammary artery in an end-to-side fashion.  At the site of distal anastomosis the target vessel was poor quality and measured approximately 1.5 mm in diameter.  There was diffuse plaque throughout this vessel, particularly along the posterior wall.  All proximal vein graft anastomoses were placed directly to the ascending aorta prior to removal of the aortic cross clamp.  The septal myocardial temperature rose rapidly after reperfusion of the left internal mammary artery graft.  The aortic cross clamp was removed after a total cross clamp time of 81 minutes.   Procedure Completion:  All proximal and distal coronary anastomoses were inspected for hemostasis and appropriate graft orientation. Epicardial pacing wires are fixed to the right ventricular outflow tract and to the right atrial appendage. The patient is rewarmed to  37C temperature. The  patient is weaned and disconnected from cardiopulmonary bypass.  The patient's rhythm at separation from bypass was AV paced.  The patient was weaned from cardioplegic bypass on low dose milrinone infusion. Total cardiopulmonary bypass time for the operation was 113 minutes.  Followup transesophageal echocardiogram performed after separation from bypass revealed  no changes from the preoperative exam.  The aortic and venous cannula were removed uneventfully. Protamine was administered to reverse the anticoagulation. The mediastinum and pleural space were inspected for hemostasis and irrigated with saline solution. The mediastinum and the left pleural space were drained using 3 chest tubes placed through separate stab incisions inferiorly.  The soft tissues anterior to the aorta were reapproximated loosely. The sternum is closed with double strength sternal wire. The soft tissues anterior to the sternum were closed in multiple layers and the skin is closed with a running subcuticular skin closure.  The post-bypass portion of the operation was notable for stable rhythm and hemodynamics.  No blood products were administered during the operation.   Disposition:  The patient tolerated the procedure well and is transported to the surgical intensive care in stable condition. There are no intraoperative complications. All sponge instrument and needle counts are verified correct at completion of the operation.    Valentina Gu. Roxy Manns MD 04/04/2014 1:44 PM

## 2014-04-04 NOTE — Progress Notes (Signed)
S/p CABG x 3  Intubated- starting to wean  BP 107/57  Pulse 75  Temp(Src) 98.7 F (37.1 C) (Oral)  Resp 19  Ht 6' (1.829 m)  Wt 344 lb 5.7 oz (156.2 kg)  BMI 46.69 kg/m2  SpO2 99%  CO= 5.2   Intake/Output Summary (Last 24 hours) at 04/04/14 1744 Last data filed at 04/04/14 1400  Gross per 24 hour  Intake 3852.8 ml  Output   3595 ml  Net  257.8 ml  Hct 39  Minimal CT output  Doing well early postop

## 2014-04-04 NOTE — Anesthesia Postprocedure Evaluation (Signed)
  Anesthesia Post-op Note  Patient: Fernando Schmidt  Procedure(s) Performed: Procedure(s): Coronary artery bypass graft times three using left internal mammary artery and right leg greater saphenous vein. (N/A) INTRAOPERATIVE TRANSESOPHAGEAL ECHOCARDIOGRAM (N/A)  Patient Location: PACU and SICU  Anesthesia Type:General  Level of Consciousness: Patient remains intubated per anesthesia plan  Airway and Oxygen Therapy: Patient remains intubated per anesthesia plan  Post-op Pain: mild  Post-op Assessment: Post-op Vital signs reviewed  Post-op Vital Signs: Reviewed  Last Vitals:  Filed Vitals:   04/04/14 1420  BP: 107/57  Pulse:   Temp:   Resp:     Complications: No apparent anesthesia complications

## 2014-04-04 NOTE — ED Provider Notes (Signed)
Medical screening examination/treatment/procedure(s) were conducted as a shared visit with non-physician practitioner(s) and myself.  I personally evaluated the patient during the encounter.   EKG Interpretation   Date/Time:  Tuesday April 03 2014 14:01:43 EDT Ventricular Rate:  87 PR Interval:  134 QRS Duration: 95 QT Interval:  336 QTC Calculation: 404 R Axis:   28 Text Interpretation:  Sinus rhythm `st elev avr, st dep inf `note this is  pts second ecg ' r sided ecg per staff'. Confirmed by Ashok Cordia  MD, Lennette Bihari  ([id number]) on 04/03/2014 2:07:17 PM      Pt c/o intermittently chest pain in past couple weeks, lasting 5-10 minutes, worse w exertion, better with rest.  Today at rest, mid chest crushing chest pain, radiated towards shoulders, +sob. Not pleuritic. +diaphoresis. No cough or uri c/o. No fever or chills. No tearing/ripping sensation or pain through to back. +fam hx cad. +smoker.   Initial ecg with st elev isolated to AVR, however also noted w st dep inf and lat.  ?acute inf/post ischemia/ACS.  Code stemi/cath lab activated upon me initially seeing ecg.  Discussed w card - will go directly to cath lab.   CRITICAL CARE  RE ACUTE CORONARY SYNDROME, UNSTABLE ANGINA, ACUTE MI Performed by: Mirna Mires Total critical care time: 35 Critical care time was exclusive of separately billable procedures and treating other patients. Critical care was necessary to treat or prevent imminent or life-threatening deterioration. Critical care was time spent personally by me on the following activities: development of treatment plan with patient and/or surrogate as well as nursing, discussions with consultants, evaluation of patient's response to treatment, examination of patient, obtaining history from patient or surrogate, ordering and performing treatments and interventions, ordering and review of laboratory studies, ordering and review of radiographic studies, pulse oximetry and re-evaluation of  patient's condition.  Results for orders placed during the hospital encounter of 04/03/14  SURGICAL PCR SCREEN      Result Value Ref Range   MRSA, PCR NEGATIVE  NEGATIVE   Staphylococcus aureus NEGATIVE  NEGATIVE  CBC      Result Value Ref Range   WBC 8.6  4.0 - 10.5 K/uL   RBC 5.51  4.22 - 5.81 MIL/uL   Hemoglobin 16.9  13.0 - 17.0 g/dL   HCT 48.3  39.0 - 52.0 %   MCV 87.7  78.0 - 100.0 fL   MCH 30.7  26.0 - 34.0 pg   MCHC 35.0  30.0 - 36.0 g/dL   RDW 13.9  11.5 - 15.5 %   Platelets 206  150 - 400 K/uL  BASIC METABOLIC PANEL      Result Value Ref Range   Sodium 138  137 - 147 mEq/L   Potassium 4.2  3.7 - 5.3 mEq/L   Chloride 96  96 - 112 mEq/L   CO2 25  19 - 32 mEq/L   Glucose, Bld 85  70 - 99 mg/dL   BUN 11  6 - 23 mg/dL   Creatinine, Ser 1.10  0.50 - 1.35 mg/dL   Calcium 10.2  8.4 - 10.5 mg/dL   GFR calc non Af Amer 79 (*) >90 mL/min   GFR calc Af Amer >90  >90 mL/min  PRO B NATRIURETIC PEPTIDE      Result Value Ref Range   Pro B Natriuretic peptide (BNP) 38.9  0 - 125 pg/mL  APTT      Result Value Ref Range   aPTT 34  24 - 37 seconds  PROTIME-INR      Result Value Ref Range   Prothrombin Time 12.8  11.6 - 15.2 seconds   INR 0.98  0.00 - 1.49  TROPONIN I      Result Value Ref Range   Troponin I 1.52 (*) <0.30 ng/mL  TROPONIN I      Result Value Ref Range   Troponin I 3.96 (*) <0.30 ng/mL  TROPONIN I      Result Value Ref Range   Troponin I 3.28 (*) <0.30 ng/mL  TSH      Result Value Ref Range   TSH 1.270  0.350 - 4.500 uIU/mL  HEMOGLOBIN A1C      Result Value Ref Range   Hemoglobin A1C 5.1  <5.7 %   Mean Plasma Glucose 100  <117 mg/dL  BLOOD GAS, ARTERIAL      Result Value Ref Range   FIO2 0.21     pH, Arterial 7.423  7.350 - 7.450   pCO2 arterial 37.6  35.0 - 45.0 mmHg   pO2, Arterial 86.1  80.0 - 100.0 mmHg   Bicarbonate 24.2 (*) 20.0 - 24.0 mEq/L   TCO2 25.3  0 - 100 mmol/L   Acid-Base Excess 0.3  0.0 - 2.0 mmol/L   O2 Saturation 96.9      Patient temperature 98.5     Collection site LEFT RADIAL     Drawn by [phone]     Sample type ARTERIAL DRAW     Allens test (pass/fail) PASS  PASS  COMPREHENSIVE METABOLIC PANEL      Result Value Ref Range   Sodium 139  137 - 147 mEq/L   Potassium 4.7  3.7 - 5.3 mEq/L   Chloride 100  96 - 112 mEq/L   CO2 22  19 - 32 mEq/L   Glucose, Bld 86  70 - 99 mg/dL   BUN 11  6 - 23 mg/dL   Creatinine, Ser 0.91  0.50 - 1.35 mg/dL   Calcium 9.5  8.4 - 10.5 mg/dL   Total Protein 6.8  6.0 - 8.3 g/dL   Albumin 3.4 (*) 3.5 - 5.2 g/dL   AST 23  0 - 37 U/L   ALT 21  0 - 53 U/L   Alkaline Phosphatase 91  39 - 117 U/L   Total Bilirubin 0.3  0.3 - 1.2 mg/dL   GFR calc non Af Amer >90  >90 mL/min   GFR calc Af Amer >90  >90 mL/min  URINALYSIS, ROUTINE W REFLEX MICROSCOPIC      Result Value Ref Range   Color, Urine YELLOW  YELLOW   APPearance CLEAR  CLEAR   Specific Gravity, Urine 1.035 (*) 1.005 - 1.030   pH 6.0  5.0 - 8.0   Glucose, UA NEGATIVE  NEGATIVE mg/dL   Hgb urine dipstick NEGATIVE  NEGATIVE   Bilirubin Urine NEGATIVE  NEGATIVE   Ketones, ur NEGATIVE  NEGATIVE mg/dL   Protein, ur NEGATIVE  NEGATIVE mg/dL   Urobilinogen, UA 2.0 (*) 0.0 - 1.0 mg/dL   Nitrite NEGATIVE  NEGATIVE   Leukocytes, UA NEGATIVE  NEGATIVE  CBC      Result Value Ref Range   WBC 7.2  4.0 - 10.5 K/uL   RBC 5.11  4.22 - 5.81 MIL/uL   Hemoglobin 15.1  13.0 - 17.0 g/dL   HCT 45.0  39.0 - 52.0 %   MCV 88.1  78.0 - 100.0 fL   MCH 29.5  26.0 - 34.0 pg   MCHC  33.6  30.0 - 36.0 g/dL   RDW 13.9  11.5 - 15.5 %   Platelets 163  150 - 400 K/uL  BASIC METABOLIC PANEL      Result Value Ref Range   Sodium 140  137 - 147 mEq/L   Potassium 4.1  3.7 - 5.3 mEq/L   Chloride 102  96 - 112 mEq/L   CO2 27  19 - 32 mEq/L   Glucose, Bld 88  70 - 99 mg/dL   BUN 10  6 - 23 mg/dL   Creatinine, Ser 0.88  0.50 - 1.35 mg/dL   Calcium 9.3  8.4 - 10.5 mg/dL   GFR calc non Af Amer >90  >90 mL/min   GFR calc Af Amer >90  >90 mL/min  LIPID  PANEL      Result Value Ref Range   Cholesterol 198  0 - 200 mg/dL   Triglycerides 298 (*) <150 mg/dL   HDL 18 (*) >39 mg/dL   Total CHOL/HDL Ratio 11.0     VLDL 60 (*) 0 - 40 mg/dL   LDL Cholesterol 120 (*) 0 - 99 mg/dL  I-STAT TROPOININ, ED      Result Value Ref Range   Troponin i, poc 0.37 (*) 0.00 - 0.08 ng/mL   Comment NOTIFIED PHYSICIAN     Comment 3           TYPE AND SCREEN      Result Value Ref Range   ABO/RH(D) A POS     Antibody Screen NEG     Sample Expiration 04/06/2014    ABO/RH      Result Value Ref Range   ABO/RH(D) A POS     Dg Chest Port 1 View  04/03/2014   CLINICAL DATA:  Chest pain and shortness of breath, pre CABG  EXAM: PORTABLE CHEST - 1 VIEW  COMPARISON:  Portable exam 1703 hr without priors for comparison.  FINDINGS: Lordotic positioning.  Normal heart size, mediastinal contours, and pulmonary vascularity.  External pacing leads present.  Lateral LEFT costophrenic angle excluded.  No gross infiltrate, pleural effusion or pneumothorax.  IMPRESSION: No acute abnormalities.   Electronically Signed   By: Lavonia Dana M.D.   On: 04/03/2014 17:12       Mirna Mires, MD 04/04/14 [phone]

## 2014-04-04 NOTE — Procedures (Signed)
Extubation Procedure Note  Patient Details:   Name: Fernando Schmidt DOB: 1966-08-31 MRN: [id number]   Airway Documentation:   Extubated pt to 4L [place], No stridor noted. BBS dim and equal, pt coughing up mod tan/clear secretions. Pt able to vocalize.   Evaluation  O2 sats: stable throughout and currently acceptable Complications: No apparent complications Patient did tolerate procedure well. Bilateral Breath Sounds: Diminished Suctioning: Airway   Lissa Merlin 04/04/2014, 6:32 PM

## 2014-04-04 NOTE — Brief Op Note (Addendum)
04/03/2014 - 04/04/2014  11:59 AM  PATIENT:  Drusilla Kanner Stanforth  48 y.o. male  PRE-OPERATIVE DIAGNOSIS:  CAD  POST-OPERATIVE DIAGNOSIS:  coronary artery disease  PROCEDURE:  Procedure(s):  CORONARY ARTERY BYPASS GRAFTING X 3 -LIMA to LAD -SVG to OM -SVG to PDA  ENDOSCOPIC SAPHENOUS VEIN HARVEST RIGHT LEG  OPEN LEFT RADIAL EXPOSED -POOR SIZE, NOT HARVESTED  INTRAOPERATIVE TRANSESOPHAGEAL ECHOCARDIOGRAM (N/A)  SURGEON:    Rexene Alberts, MD  ASSISTANTS:  Ellwood Handler, PA-C  ANESTHESIA:   Finis Bud, MD  CROSSCLAMP TIME:   30'  CARDIOPULMONARY BYPASS TIME: 113'  FINDINGS:  Mild LV dysfunction with mild inferobasal hypokinesis  Good quality LIMA conduit for grafting  Large caliber but otherwise good quality SVG conduit for grafting  Small caliber LRA - exposed at the wrist but not harvested  Diffuse CAD with poor target vessels for grafting  COMPLICATIONS: None  BASELINE WEIGHT: 156 kg  PATIENT DISPOSITION:   TO SICU IN STABLE CONDITION  OWEN,CLARENCE H 04/04/2014 1:38 PM

## 2014-04-05 ENCOUNTER — Inpatient Hospital Stay (HOSPITAL_COMMUNITY): Payer: BC Managed Care – PPO

## 2014-04-05 ENCOUNTER — Encounter (HOSPITAL_COMMUNITY): Payer: Self-pay | Admitting: Thoracic Surgery (Cardiothoracic Vascular Surgery)

## 2014-04-05 DIAGNOSIS — I2510 Atherosclerotic heart disease of native coronary artery without angina pectoris: Secondary | ICD-10-CM

## 2014-04-05 DIAGNOSIS — I10 Essential (primary) hypertension: Secondary | ICD-10-CM

## 2014-04-05 DIAGNOSIS — J9383 Other pneumothorax: Secondary | ICD-10-CM

## 2014-04-05 DIAGNOSIS — I498 Other specified cardiac arrhythmias: Secondary | ICD-10-CM

## 2014-04-05 LAB — POCT I-STAT, CHEM 8
BUN: 16 mg/dL (ref 6–23)
Calcium, Ion: 1.17 mmol/L (ref 1.12–1.23)
Chloride: 97 mEq/L (ref 96–112)
Creatinine, Ser: 1.1 mg/dL (ref 0.50–1.35)
Glucose, Bld: 98 mg/dL (ref 70–99)
HCT: 32 % — ABNORMAL LOW (ref 39.0–52.0)
HEMOGLOBIN: 10.9 g/dL — AB (ref 13.0–17.0)
POTASSIUM: 4.1 meq/L (ref 3.7–5.3)
Sodium: 139 mEq/L (ref 137–147)
TCO2: 25 mmol/L (ref 0–100)

## 2014-04-05 LAB — BASIC METABOLIC PANEL
BUN: 12 mg/dL (ref 6–23)
CHLORIDE: 101 meq/L (ref 96–112)
CO2: 24 mEq/L (ref 19–32)
Calcium: 8.2 mg/dL — ABNORMAL LOW (ref 8.4–10.5)
Creatinine, Ser: 0.92 mg/dL (ref 0.50–1.35)
GFR calc Af Amer: >90 mL/min (ref >90)
Glucose, Bld: 125 mg/dL — ABNORMAL HIGH (ref 70–99)
POTASSIUM: 4.4 meq/L (ref 3.7–5.3)
SODIUM: 138 meq/L (ref 137–147)

## 2014-04-05 LAB — GLUCOSE, CAPILLARY
GLUCOSE-CAPILLARY: 114 mg/dL — AB (ref 70–99)
GLUCOSE-CAPILLARY: 121 mg/dL — AB (ref 70–99)
GLUCOSE-CAPILLARY: 147 mg/dL — AB (ref 70–99)
GLUCOSE-CAPILLARY: 125 mg/dL — AB (ref 70–99)
GLUCOSE-CAPILLARY: 133 mg/dL — AB (ref 70–99)
GLUCOSE-CAPILLARY: 128 mg/dL — AB (ref 70–99)
GLUCOSE-CAPILLARY: 112 mg/dL — AB (ref 70–99)
GLUCOSE-CAPILLARY: 116 mg/dL — AB (ref 70–99)
Glucose-Capillary: 104 mg/dL — ABNORMAL HIGH (ref 70–99)
Glucose-Capillary: 108 mg/dL — ABNORMAL HIGH (ref 70–99)
Glucose-Capillary: 116 mg/dL — ABNORMAL HIGH (ref 70–99)
Glucose-Capillary: 121 mg/dL — ABNORMAL HIGH (ref 70–99)
Glucose-Capillary: 122 mg/dL — ABNORMAL HIGH (ref 70–99)
Glucose-Capillary: 123 mg/dL — ABNORMAL HIGH (ref 70–99)
Glucose-Capillary: 124 mg/dL — ABNORMAL HIGH (ref 70–99)
Glucose-Capillary: 124 mg/dL — ABNORMAL HIGH (ref 70–99)
Glucose-Capillary: 126 mg/dL — ABNORMAL HIGH (ref 70–99)
Glucose-Capillary: 127 mg/dL — ABNORMAL HIGH (ref 70–99)
Glucose-Capillary: 130 mg/dL — ABNORMAL HIGH (ref 70–99)
Glucose-Capillary: 131 mg/dL — ABNORMAL HIGH (ref 70–99)
Glucose-Capillary: 140 mg/dL — ABNORMAL HIGH (ref 70–99)
Glucose-Capillary: 148 mg/dL — ABNORMAL HIGH (ref 70–99)
Glucose-Capillary: 149 mg/dL — ABNORMAL HIGH (ref 70–99)

## 2014-04-05 LAB — CBC
HCT: 32.5 % — ABNORMAL LOW (ref 39.0–52.0)
HCT: 32.1 % — ABNORMAL LOW (ref 39.0–52.0)
Hemoglobin: 11.1 g/dL — ABNORMAL LOW (ref 13.0–17.0)
Hemoglobin: 10.8 g/dL — ABNORMAL LOW (ref 13.0–17.0)
MCH: 29.9 pg (ref 26.0–34.0)
MCH: 29.7 pg (ref 26.0–34.0)
MCHC: 34.2 g/dL (ref 30.0–36.0)
MCHC: 33.6 g/dL (ref 30.0–36.0)
MCV: 87.6 fL (ref 78.0–100.0)
MCV: 88.2 fL (ref 78.0–100.0)
PLATELETS: 139 10*3/uL — AB (ref 150–400)
Platelets: 157 10*3/uL (ref 150–400)
RBC: 3.71 MIL/uL — ABNORMAL LOW (ref 4.22–5.81)
RBC: 3.64 MIL/uL — ABNORMAL LOW (ref 4.22–5.81)
RDW: 13.9 % (ref 11.5–15.5)
RDW: 14.1 % (ref 11.5–15.5)
WBC: 11.2 10*3/uL — AB (ref 4.0–10.5)
WBC: 11.1 10*3/uL — ABNORMAL HIGH (ref 4.0–10.5)

## 2014-04-05 LAB — BLOOD GAS, ARTERIAL
Acid-Base Excess: 1.1 mmol/L (ref 0.0–2.0)
BICARBONATE: 25.4 meq/L — AB (ref 20.0–24.0)
O2 CONTENT: 4 L/min
O2 SAT: 98.8 %
PATIENT TEMPERATURE: 99.8
TCO2: 26.7 mmol/L (ref 0–100)
pCO2 arterial: 43.8 mmHg (ref 35.0–45.0)
pH, Arterial: 7.386 (ref 7.350–7.450)
pO2, Arterial: 85 mmHg (ref 80.0–100.0)

## 2014-04-05 LAB — CREATININE, SERUM
CREATININE: 0.95 mg/dL (ref 0.50–1.35)
GFR calc Af Amer: >90 mL/min (ref >90)
GFR calc non Af Amer: >90 mL/min (ref >90)

## 2014-04-05 LAB — MAGNESIUM
Magnesium: 2.4 mg/dL (ref 1.5–2.5)
Magnesium: 2.3 mg/dL (ref 1.5–2.5)

## 2014-04-05 MED ORDER — MORPHINE SULFATE 2 MG/ML IJ SOLN
2.0000 mg | INTRAMUSCULAR | Status: DC | PRN
  Administered 2014-04-05 – 2014-04-06 (×2): 2 mg via INTRAVENOUS
  Filled 2014-04-05 (×2): qty 1

## 2014-04-05 MED ORDER — METOPROLOL TARTRATE 25 MG PO TABS
25.0000 mg | ORAL_TABLET | Freq: Two times a day (BID) | ORAL | Status: DC
  Administered 2014-04-05 (×2): 25 mg via ORAL
  Filled 2014-04-05 (×5): qty 1

## 2014-04-05 MED ORDER — MILRINONE IN DEXTROSE 20 MG/100ML IV SOLN: 0.0000 ug/kg/min | INTRAVENOUS | Status: DC

## 2014-04-05 MED ORDER — INSULIN DETEMIR 100 UNIT/ML ~~LOC~~ SOLN
30.0000 [IU] | Freq: Once | SUBCUTANEOUS | Status: AC
  Administered 2014-04-05: 30 [IU] via SUBCUTANEOUS
  Filled 2014-04-05: qty 0.3

## 2014-04-05 MED ORDER — ATORVASTATIN CALCIUM 80 MG PO TABS
80.0000 mg | ORAL_TABLET | Freq: Every day | ORAL | Status: DC
  Administered 2014-04-05 – 2014-04-08 (×4): 80 mg via ORAL
  Filled 2014-04-05 (×5): qty 1

## 2014-04-05 MED ORDER — INSULIN ASPART 100 UNIT/ML ~~LOC~~ SOLN
0.0000 [IU] | SUBCUTANEOUS | Status: DC
  Administered 2014-04-06 (×2): 2 [IU] via SUBCUTANEOUS

## 2014-04-05 MED ORDER — FUROSEMIDE 10 MG/ML IJ SOLN
40.0000 mg | Freq: Four times a day (QID) | INTRAMUSCULAR | Status: AC
  Administered 2014-04-05 – 2014-04-06 (×3): 40 mg via INTRAVENOUS
  Filled 2014-04-05 (×3): qty 4

## 2014-04-05 MED ORDER — KETOROLAC TROMETHAMINE 30 MG/ML IJ SOLN
30.0000 mg | Freq: Four times a day (QID) | INTRAMUSCULAR | Status: DC
  Administered 2014-04-05 (×2): 30 mg via INTRAVENOUS
  Filled 2014-04-05 (×2): qty 1

## 2014-04-05 MED FILL — Potassium Chloride Inj 2 mEq/ML: INTRAVENOUS | Qty: 40 | Status: AC

## 2014-04-05 MED FILL — Heparin Sodium (Porcine) Inj 1000 Unit/ML: INTRAMUSCULAR | Qty: 30 | Status: AC

## 2014-04-05 MED FILL — Dexmedetomidine HCl IV Soln 200 MCG/2ML: INTRAVENOUS | Qty: 2 | Status: AC

## 2014-04-05 MED FILL — Magnesium Sulfate Inj 50%: INTRAMUSCULAR | Qty: 10 | Status: AC

## 2014-04-05 NOTE — Progress Notes (Signed)
CT surgery p.m. Rounds  Patient stable sitting up in bed Right percutaneous chest tube placed for postop pneumothorax, chest x-ray reviewed revealing reexpansion of lung No apparent airleak Maintained sinus rhythm Evening labs reviewed and are satisfactory Continue current care

## 2014-04-05 NOTE — Op Note (Signed)
CARDIOTHORACIC SURGERY OPERATIVE NOTE  Date of Procedure:  04/05/2014  Preoperative Diagnosis: Right Pneumothorax  Postoperative Diagnosis: Same  Procedure:   Right chest tube placement  Surgeon:   Valentina Gu. Roxy Manns, MD  Anesthesia:   1% lidocaine local    DETAILS OF THE OPERATIVE PROCEDURE  Following full informed consent the patient was continuously monitored for rhythm, BP and oxygen saturation. The right anterior chest was prepared and draped in a sterile manner. 1% lidocaine was utilized to anesthetize the skin and subcutaneous tissues. A small incision was made and a [place] pig-tail chest tube was placed through the incision into the pleural space using the Seldinger technique. The tube was secured to the skin and connected to a closed suction collection device. The patient tolerated the procedure well. A portable CXR was ordered. There were no complications.    Valentina Gu. Roxy Manns, MD 04/05/2014 2:05 PM

## 2014-04-05 NOTE — Progress Notes (Signed)
Has sinus tachycardia. O2 Sats are okay. Has 20% Pneumothorax which could be driving th tachycardia. Nurse will make surgeons aware.

## 2014-04-05 NOTE — Progress Notes (Signed)
MD owen made aware of low UOP and of CT output of 250 since 7am. Will monitor-  Fernando Schmidt

## 2014-04-05 NOTE — Progress Notes (Addendum)
      [address]       [place] [address]             [phone]        CARDIOTHORACIC SURGERY PROGRESS NOTE   R1 Day Post-Op Procedure(s) (LRB): Coronary artery bypass graft times three using left internal mammary artery and right leg greater saphenous vein. (N/A) INTRAOPERATIVE TRANSESOPHAGEAL ECHOCARDIOGRAM (N/A)  Subjective: Looks good.  Mild soreness in chest.  Overall feels pretty well.  Objective: Vital signs: BP Readings from Last 1 Encounters:  04/05/14 121/60   Pulse Readings from Last 1 Encounters:  04/05/14 107   Resp Readings from Last 1 Encounters:  04/05/14 21   Temp Readings from Last 1 Encounters:  04/05/14 99.1 F (37.3 C)     Hemodynamics: PAP: (30-57)/(8-37) 37/20 mmHg CO:  [5.3 L/min-9.1 L/min] 9.1 L/min CI:  [2 L/min/m2-3.4 L/min/m2] 3.4 L/min/m2  Physical Exam:  Rhythm:   Sinus tach  Breath sounds: clear  Heart sounds:  RRR  Incisions:  Dressing dry, intact  Abdomen:  Soft, non-distended, non-tender  Extremities:  Warm, well-perfused    Intake/Output from previous day: 06/10 0701 - 06/11 0700 In: 5723.7 [I.V.:3209.7; Blood:814; IV Piggyback:1700] Out: 2122 [Urine:2610; Emesis/NG output:30; Drains:38; Blood:1550; Chest Tube:695] Intake/Output this shift:    Lab Results:  CBC: Recent Labs  04/04/14 1930 04/04/14 1939 04/05/14 0415  WBC 16.1*  --  11.2*  HGB 12.6* 12.9* 11.1*  HCT 38.1* 38.0* 32.5*  PLT 208  --  157    BMET:  Recent Labs  04/04/14 0344  04/04/14 1939 04/05/14 0415  NA 140  < > 141 138  K 4.1  < > 4.8 4.4  CL 102  --  101 101  CO2 27  --   --  24  GLUCOSE 88  < > 129* 125*  BUN 10  --  10 12  CREATININE 0.88  < > 1.00 0.92  CALCIUM 9.3  --   --  8.2*  < > = values in this interval not displayed.   CBG (last 3)  No results found for this basename: GLUCAP,  in the last 72 hours  ABG    Component Value Date/Time   PHART 7.386 04/05/2014 0437   PCO2ART 43.8 04/05/2014 0437   PO2ART 85.0 04/05/2014 0437   HCO3 25.4* 04/05/2014 0437   TCO2 26.7 04/05/2014 0437   ACIDBASEDEF 2.0 04/04/2014 1933   O2SAT 98.8 04/05/2014 0437    EKG: Sinus tach w/out ischemic changes  CXR: Mild bibasilar atelectasis. ? Right PTX vs skin fold  Assessment/Plan: S/P Procedure(s) (LRB): Coronary artery bypass graft times three using left internal mammary artery and right leg greater saphenous vein. (N/A) INTRAOPERATIVE TRANSESOPHAGEAL ECHOCARDIOGRAM (N/A)  Doing very well POD1 Maintaining NSR w/ stable hemodynamics Expected post op acute blood loss anemia, very mild Expected post op atelectasis, mild Expected post op volume excess, mild, diuresing Pre-op acute non-STEMI Morbid obesity Pulmonary hypertension Likely OSA w/ obesity hypoventilation syndrome Longstanding tobacco abuse Hyperlipidemia   Repeat upright pCXR r/o PTX  Mobilize  Pulm toilet  Increase beta blocker  Add ACE-I, statin  D/C lines  D/C tubes later today or tomorrow once output decreased  Possible transfer step down later today or in am   Broc Caspers H 04/05/2014 7:37 AM

## 2014-04-06 ENCOUNTER — Inpatient Hospital Stay (HOSPITAL_COMMUNITY): Payer: BC Managed Care – PPO

## 2014-04-06 LAB — GLUCOSE, CAPILLARY: GLUCOSE-CAPILLARY: 128 mg/dL — AB (ref 70–99)

## 2014-04-06 LAB — CBC
HCT: 33.7 % — ABNORMAL LOW (ref 39.0–52.0)
HEMOGLOBIN: 11.1 g/dL — AB (ref 13.0–17.0)
MCH: 29.3 pg (ref 26.0–34.0)
MCHC: 32.9 g/dL (ref 30.0–36.0)
MCV: 88.9 fL (ref 78.0–100.0)
PLATELETS: 156 10*3/uL (ref 150–400)
RBC: 3.79 MIL/uL — ABNORMAL LOW (ref 4.22–5.81)
RDW: 14.4 % (ref 11.5–15.5)
WBC: 13.3 10*3/uL — ABNORMAL HIGH (ref 4.0–10.5)

## 2014-04-06 LAB — BASIC METABOLIC PANEL
BUN: 18 mg/dL (ref 6–23)
CO2: 27 mEq/L (ref 19–32)
CREATININE: 0.93 mg/dL (ref 0.50–1.35)
Calcium: 8.4 mg/dL (ref 8.4–10.5)
Chloride: 98 mEq/L (ref 96–112)
GFR calc Af Amer: >90 mL/min (ref >90)
GLUCOSE: 108 mg/dL — AB (ref 70–99)
POTASSIUM: 4 meq/L (ref 3.7–5.3)
Sodium: 137 mEq/L (ref 137–147)

## 2014-04-06 MED ORDER — FUROSEMIDE 40 MG PO TABS: 40.0000 mg | ORAL_TABLET | Freq: Two times a day (BID) | ORAL | Status: DC

## 2014-04-06 MED ORDER — TRAMADOL HCL 50 MG PO TABS: 50.0000 mg | ORAL_TABLET | ORAL | Status: DC | PRN

## 2014-04-06 MED ORDER — GUAIFENESIN ER 600 MG PO TB12
600.0000 mg | ORAL_TABLET | Freq: Two times a day (BID) | ORAL | Status: DC | PRN
  Filled 2014-04-06: qty 1

## 2014-04-06 MED ORDER — FUROSEMIDE 10 MG/ML IJ SOLN
40.0000 mg | Freq: Four times a day (QID) | INTRAMUSCULAR | Status: AC
  Administered 2014-04-06 (×3): 40 mg via INTRAVENOUS
  Filled 2014-04-06 (×4): qty 4

## 2014-04-06 MED ORDER — MOVING RIGHT ALONG BOOK
Freq: Once | Status: AC
  Administered 2014-04-06: 09:00:00
  Filled 2014-04-06: qty 1

## 2014-04-06 MED ORDER — POTASSIUM CHLORIDE CRYS ER 20 MEQ PO TBCR
20.0000 meq | EXTENDED_RELEASE_TABLET | Freq: Two times a day (BID) | ORAL | Status: DC
  Administered 2014-04-07 – 2014-04-08 (×4): 20 meq via ORAL
  Filled 2014-04-06 (×8): qty 1

## 2014-04-06 MED ORDER — LISINOPRIL 5 MG PO TABS
5.0000 mg | ORAL_TABLET | Freq: Every day | ORAL | Status: DC
  Administered 2014-04-06 – 2014-04-09 (×4): 5 mg via ORAL
  Filled 2014-04-06 (×4): qty 1

## 2014-04-06 MED ORDER — SODIUM CHLORIDE 0.9 % IV SOLN: 250.0000 mL | INTRAVENOUS | Status: DC | PRN

## 2014-04-06 MED ORDER — ALBUTEROL SULFATE (2.5 MG/3ML) 0.083% IN NEBU: 2.5000 mg | INHALATION_SOLUTION | Freq: Four times a day (QID) | RESPIRATORY_TRACT | Status: DC | PRN

## 2014-04-06 MED ORDER — SODIUM CHLORIDE 0.9 % IJ SOLN
3.0000 mL | Freq: Two times a day (BID) | INTRAMUSCULAR | Status: DC
  Administered 2014-04-06 – 2014-04-08 (×5): 3 mL via INTRAVENOUS

## 2014-04-06 MED ORDER — METOPROLOL TARTRATE 50 MG PO TABS
50.0000 mg | ORAL_TABLET | Freq: Two times a day (BID) | ORAL | Status: DC
  Administered 2014-04-06 – 2014-04-09 (×7): 50 mg via ORAL
  Filled 2014-04-06 (×8): qty 1

## 2014-04-06 MED ORDER — FUROSEMIDE 40 MG PO TABS
40.0000 mg | ORAL_TABLET | Freq: Two times a day (BID) | ORAL | Status: DC
  Administered 2014-04-07 – 2014-04-08 (×4): 40 mg via ORAL
  Filled 2014-04-06 (×7): qty 1

## 2014-04-06 MED ORDER — SODIUM CHLORIDE 0.9 % IJ SOLN: 3.0000 mL | INTRAMUSCULAR | Status: DC | PRN

## 2014-04-06 MED FILL — Sodium Bicarbonate IV Soln 8.4%: INTRAVENOUS | Qty: 50 | Status: AC

## 2014-04-06 MED FILL — Lidocaine HCl IV Inj 20 MG/ML: INTRAVENOUS | Qty: 5 | Status: AC

## 2014-04-06 MED FILL — Heparin Sodium (Porcine) Inj 1000 Unit/ML: INTRAMUSCULAR | Qty: 10 | Status: AC

## 2014-04-06 MED FILL — Mannitol IV Soln 20%: INTRAVENOUS | Qty: 500 | Status: AC

## 2014-04-06 MED FILL — Electrolyte-R (PH 7.4) Solution: INTRAVENOUS | Qty: 3000 | Status: AC

## 2014-04-06 MED FILL — Sodium Chloride IV Soln 0.9%: INTRAVENOUS | Qty: 4000 | Status: AC

## 2014-04-06 NOTE — Progress Notes (Signed)
Ambulated to 2W with WC and monitor, placed in bed, SCD's with pt, staff in to receive. 2w36

## 2014-04-06 NOTE — Discharge Summary (Signed)
Physician Discharge Summary  Patient ID: Fernando Schmidt MRN: [id number] DOB/AGE: 06/10/1966 48 y.o.  Admit date: 04/03/2014 Discharge date: 04/09/2014  Admission Diagnoses:  Patient Active Problem List   Diagnosis Date Noted  . NSTEMI (non-ST elevated myocardial infarction) 04/03/2014  . Morbid obesity 04/03/2014  . Tobacco abuse 04/03/2014  . Coronary artery disease 04/03/2014  . Hypertension   . High cholesterol   . Arthritis    Discharge Diagnoses:   Patient Active Problem List   Diagnosis Date Noted  . 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 NSTEMI (non-ST elevated myocardial infarction) 04/03/2014  . Morbid obesity 04/03/2014  . Tobacco abuse 04/03/2014  . Coronary artery disease 04/03/2014  . Hypertension   . High cholesterol   . Arthritis    Discharged Condition: good  History of Present Illness:   Fernando Schmidt is a 48 yo morbidly obese white male with no previous history of coronary artery disease but multiple risk factors including hypertension, hyperlipidemia, long-standing tobacco abuse, and a strong family history of coronary artery disease. The patient reports being in his usual state of health until approximately 2 or 3 weeks ago when he first began to experience symptoms of exertional substernal chest tightness and shortness of breath. Symptoms initially always resolved with rest but gradually increased in both frequency and intensity. Earlier today the patient developed severe onset of substernal chest tightness while he was at work. Symptoms persisted and became progressively severe and associated with resting shortness of breath. EMS was called and the patient was brought to the emergency department. In transport the patient received sublingual nitroglycerin and aspirin. By the time the patient arrived his chest discomfort had nearly completely resolved. EKG revealed sinus rhythm with diffuse ST segment depression. The patient was brought directly to the cardiac cath lab by  Dr. Burt Knack. Catheterization revealed severe three-vessel coronary artery disease with reasonably well-preserved left ventricular systolic function. Cardiothoracic surgical consultation was requested. By the time the patient left the cath lab and was transported to the cardiac intensive care unit his chest pain had completely resolved.  Hospital Course:   The patient was evaluated by Dr. Roxy Manns, whom after review of the patient's history felt he would benefit from coronary artery bypass grafting procedure.  The risks and benefits of the procedure were explained to the patient and he was agreeable to proceed.  He was taken to the operating room on 04/05/2014.  He underwent CABG x 3 utilizing LIMA to LAD, SVG to PDA to SVG to OM.  He also underwent Endoscopic saphenous vein harvest from his right leg.  His left arm was explored for radial artery harvest and it was felt not to be suitable for conduit.  The patient tolerated the procedure well and was taken to the SICU in stable condition.  The patient was extubated the evening of surgery.  During his stay in the ICU the patient developed some labored breathing repeat CXR was obtained and showed a right sided pneumothorax.  Therefore, he underwent right sided chest tube placement.  Follow up CXR showed re expansion of the lung.  The patient progressed without difficulty.  His patient's chest tube and arterial lines were removed without difficulty.  He was maintaining NSR and transferred to the step down unit in stable condition. He has been volume overloaded and diuresed. He has been tolerating a diet and has had a bowel movement.Epicardial pacing wires and chest tube sutures will be removed prior to discharge. He has had sero sanguinous drainage  from previous drain site on RLE. He was instructed to place dry 4x4's and tape and change daily. There are no signs of infection. He was seen by Dr. Roxy Manns this am and is felt surgically stable for discharge.  Significant  Diagnostic Studies: angiography:   Hemodynamics:  AO 117/85 with a mean of 101  LV 120/33   Coronary angiography:   Coronary dominance: right   Left mainstem: The left main is patent with 50% distal stenosis. There is a shelflike plaque in the mid left main. Moderate calcification is noted   Left anterior descending (LAD): The LAD has diffuse 70% proximal stenosis before the first septal perforator. The first diagonal is small and totally occluded. The mid vessel has 50% stenosis before the second diagonal branch. The mid LAD is patent with mild nonobstructive disease as it reaches the apex.   Left circumflex (LCx): The left circumflex has 80% proximal stenosis. The mid circumflex is totally occluded. The marginal branches are occluded. The first OM fills late from left to left collaterals.   Right coronary artery (RCA): The RCA is totally occluded just after the first RV marginal branch. The vessel has faint antegrade filling but primarily fills from left to right collaterals. The PDA and PLA branches are patent by collateral filling.   Left ventriculography: The inferior wall is mildly hypokinetic. There does not appear to be significant mitral regurgitation.  The estimated LVEF is 55-60%.   Left subclavian angiography: The subclavian is patent. The left internal mammary artery is patent and appears to be a suitable conduit for grafting.   Abdominal aortic angiography: The abdominal aorta is patent with single patent renal arteries bilaterally and no obvious evidence of aneurysm or significant stenosis  Treatments: surgery:   Coronary Artery Bypass Grafting x 3  Left Internal Mammary Artery to Distal Left Anterior Descending Coronary Artery  Saphenous Vein Graft to Posterior Descending Coronary Artery  Saphenous Vein Graft to Obtuse Marginal Branch of Left Circumflex Coronary Artery  Endoscopic Vein Harvest from Right Thigh and Lower Leg  Disposition: Home  Discharge  Instructions   Amb Referral to Cardiac Rehabilitation    Complete by:  As directed          Discharge Medications:    Medication List         aspirin 325 MG EC tablet  Take 1 tablet (325 mg total) by mouth daily.     atorvastatin 80 MG tablet  Commonly known as:  LIPITOR  Take 1 tablet (80 mg total) by mouth daily at 6 PM.     B-12 PO  Take 1 tablet by mouth daily.     lisinopril 5 MG tablet  Commonly known as:  PRINIVIL,ZESTRIL  Take 1 tablet (5 mg total) by mouth daily.     metoprolol 50 MG tablet  Commonly known as:  LOPRESSOR  Take 1 tablet (50 mg total) by mouth 2 (two) times daily.     oxyCODONE 5 MG immediate release tablet  Commonly known as:  Oxy IR/ROXICODONE  Take 1-2 tablets (5-10 mg total) by mouth every 4 (four) hours as needed for moderate pain.       The patient has been discharged on:   1.Beta Blocker:  Yes [  x ]                              No   [   ]  If No, reason:  2.Ace Inhibitor/ARB: Yes [ x  ]                                     No  [    ]                                     If No, reason:  3.Statin:   Yes [x   ]                  No  [   ]                  If No, reason:  4.Ecasa:  Yes  [ x  ]                  No   [   ]                  If No, reason:   Follow-up Information   Follow up with Rexene Alberts, MD On 05/07/2014. (Appoitnment is at 2:00)    Specialty:  Cardiothoracic Surgery   Contact information:   [address] [place] [address] [phone]       Follow up with [facility] On 05/07/2014. (Please get CXR at 1:00)    Contact information:   [place]       Follow up with Sherren Mocha, MD. (Call for a follow up appoitnment for 2 weeks)    Specialty:  Cardiology   Contact information:   [address] [place] [address] [phone]       Signed: Nani Skillern PA-C 04/09/2014, 9:06 AM

## 2014-04-06 NOTE — Progress Notes (Signed)
Doing well from cardiac standpoint. Will follow and advise if needed.
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
## 2014-04-06 NOTE — Progress Notes (Signed)
Report to 2 W RN 

## 2014-04-07 ENCOUNTER — Inpatient Hospital Stay (HOSPITAL_COMMUNITY): Payer: BC Managed Care – PPO

## 2014-04-07 LAB — CBC
HCT: 34 % — ABNORMAL LOW (ref 39.0–52.0)
Hemoglobin: 11.3 g/dL — ABNORMAL LOW (ref 13.0–17.0)
MCH: 29.4 pg (ref 26.0–34.0)
MCHC: 33.2 g/dL (ref 30.0–36.0)
MCV: 88.5 fL (ref 78.0–100.0)
PLATELETS: 184 10*3/uL (ref 150–400)
RBC: 3.84 MIL/uL — ABNORMAL LOW (ref 4.22–5.81)
RDW: 14.5 % (ref 11.5–15.5)
WBC: 14.1 10*3/uL — ABNORMAL HIGH (ref 4.0–10.5)

## 2014-04-07 LAB — BASIC METABOLIC PANEL
BUN: 26 mg/dL — ABNORMAL HIGH (ref 6–23)
CO2: 24 mEq/L (ref 19–32)
Calcium: 8.6 mg/dL (ref 8.4–10.5)
Chloride: 95 mEq/L — ABNORMAL LOW (ref 96–112)
Creatinine, Ser: 0.98 mg/dL (ref 0.50–1.35)
Glucose, Bld: 98 mg/dL (ref 70–99)
POTASSIUM: 3.7 meq/L (ref 3.7–5.3)
SODIUM: 135 meq/L — AB (ref 137–147)

## 2014-04-07 MED ORDER — POTASSIUM CHLORIDE CRYS ER 20 MEQ PO TBCR
30.0000 meq | EXTENDED_RELEASE_TABLET | Freq: Once | ORAL | Status: AC
  Administered 2014-04-07: 30 meq via ORAL
  Filled 2014-04-07: qty 1
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
## 2014-04-07 NOTE — Progress Notes (Addendum)
Applied 4 x 4 guaze pressure dressing to incision on right leg two times today. Site below knee continues to bleed unless a pressure dressing is maintained.  Site may need an intervention as this continues to bleed and pt is concerned about ambulation. Pt resting with call bell within reach.  Will continue to monitor. Payton Emerald, RN

## 2014-04-07 NOTE — Progress Notes (Signed)
Patient has declined an offer to ambulate due to GI discomfort; encouraged patient to try to do at least the 3 recommended walks daily. Advised patient that staff is available to accompany him on his walks even during the night should he decide to walk later.

## 2014-04-07 NOTE — Progress Notes (Signed)
CARDIAC REHAB PHASE I   PRE:  Rate/Rhythm: 103 ST  BP:  Sitting: 122/66     SaO2: 94% RA  MODE:  Ambulation: 550 ft   POST:  Rate/Rhythm: 108 ST  BP:  Sitting: 120/60     SaO2: 98% RA  10:50AM-11:30AM Patient ambulated independently at a slow and steady pace with one rest break.  Patient stated that he felt good and he was glad to get out of his room and walk around.  When we got back to the room and the patient sat down he stated that he got a little lightheaded that only lasted for a few seconds and was a 1 out of 10.  He stated that there was no other dizziness or pain during his walk.  The patient and I discussed Cardiac Rehab and he stated that he knows he needs to go.  I will refer to [facility] because this is closest to his home.  The patient and I also discussed how he would continue smoking cessation after he returned home.  We discussed risk factors and the importance of his IS device. Patient returned to his chair.   [facility], [place] 04/07/2014 11:26 AM

## 2014-04-08 LAB — GLUCOSE, CAPILLARY: GLUCOSE-CAPILLARY: 117 mg/dL — AB (ref 70–99)

## 2014-04-08 LAB — CBC
HCT: 33.6 % — ABNORMAL LOW (ref 39.0–52.0)
HEMOGLOBIN: 11 g/dL — AB (ref 13.0–17.0)
MCH: 28.7 pg (ref 26.0–34.0)
MCHC: 32.7 g/dL (ref 30.0–36.0)
MCV: 87.7 fL (ref 78.0–100.0)
PLATELETS: 202 10*3/uL (ref 150–400)
RBC: 3.83 MIL/uL — AB (ref 4.22–5.81)
RDW: 14.3 % (ref 11.5–15.5)
WBC: 12.4 10*3/uL — ABNORMAL HIGH (ref 4.0–10.5)

## 2014-04-08 NOTE — Progress Notes (Signed)
Removed epicardial wires per order. 3 intact.  Pt tolerated procedure well.  Pt instructed to remain on bedrest for one hour.  Frequent vitals will be taken and documented. Pt resting with call bell within reach. Lavontay Kirk McClintock, RN   
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
## 2014-04-08 NOTE — Progress Notes (Signed)
Applied fresh pressure dressing to right medial calf incision. Previous dressing came off due to saturation. Incision continues to bleed but minimally with pressure dressing. Will continue to monitor.

## 2014-04-09 MED ORDER — ATORVASTATIN CALCIUM 80 MG PO TABS: 80.0000 mg | ORAL_TABLET | Freq: Every day | ORAL | Status: DC

## 2014-04-09 MED ORDER — LISINOPRIL 5 MG PO TABS: 5.0000 mg | ORAL_TABLET | Freq: Every day | ORAL | Status: DC

## 2014-04-09 MED ORDER — ASPIRIN 325 MG PO TBEC: 325.0000 mg | DELAYED_RELEASE_TABLET | Freq: Every day | ORAL | Status: DC

## 2014-04-09 MED ORDER — OXYCODONE HCL 5 MG PO TABS: 5.0000 mg | ORAL_TABLET | ORAL | Status: DC | PRN

## 2014-04-09 MED ORDER — METOPROLOL TARTRATE 50 MG PO TABS: 50.0000 mg | ORAL_TABLET | Freq: Two times a day (BID) | ORAL | Status: DC

## 2014-04-09 MED ORDER — POTASSIUM CHLORIDE CRYS ER 20 MEQ PO TBCR
20.0000 meq | EXTENDED_RELEASE_TABLET | Freq: Every day | ORAL | Status: AC
  Administered 2014-04-09: 20 meq via ORAL

## 2014-04-09 MED ORDER — FUROSEMIDE 40 MG PO TABS
40.0000 mg | ORAL_TABLET | Freq: Every day | ORAL | Status: AC
  Administered 2014-04-09: 40 mg via ORAL
  Filled 2014-04-09: qty 1

## 2014-04-09 NOTE — Discharge Instructions (Signed)
ACTIVITY:  1.Increase activity slowly. 2.Walk daily and increase frequency and duration as tolerates. 3.May walk up steps. 4.No lifting more than ten pounds for two weeks. 5.No driving for two weeks. 6.Avoid straining. 7.STOP any activity that causes chest pain, shortness of breath, dizziness,sweating,     or excessive weakness. 8.Continue with breathing exercises daily.  DIET:  Low fat, Low salt diet   WOUND:  1.May shower. 2.Clean wounds with mild soap and water. Apply dry 4x4's and tape to right drain wound. Change daily.  Call the office at [phone] if any problems arise.       Coronary Artery Bypass Grafting, Care After Refer to this sheet in the next few weeks. These instructions provide you with information on caring for yourself after your procedure. Your health care provider may also give you more specific instructions. Your treatment has been planned according to current medical practices, but problems sometimes occur. Call your health care provider if you have any problems or questions after your procedure. WHAT TO EXPECT AFTER THE PROCEDURE Recovery from surgery will be different for everyone. Some people feel well after 3 or 4 weeks, while for others it takes longer. After your procedure, it is typical to have the following:  Nausea and a lack of appetite.   Constipation.  Weakness and fatigue.   Depression or irritability.   Pain or discomfort at your incision site. HOME CARE INSTRUCTIONS  Only take over-the-counter or prescription medicines as directed by your health care provider. Take all medicines exactly as directed. Do not stop taking medicines or start any new medicines without first checking with your health care provider.   Take your pulse as directed by your health care provider.  Perform deep breathing as directed by your health care provider. If you were given a device called an incentive spirometer, use it to practice deep breathing  several times a day. Support your chest with a pillow or your arms when you take deep breaths or cough.  Keep incision areas clean, dry, and protected. Remove or change any bandages (dressings) only as directed by your health care provider. You may have skin adhesive strips over the incision areas. Do not take the strips off. They will fall off on their own.  Check incision areas daily for any swelling, redness, or drainage.  If incisions were made in your legs, do the following:  Avoid crossing your legs.   Avoid sitting for long periods of time. Change positions every 30 minutes.   Elevate your legs when you are sitting.   Wear compression stockings as directed by your health care provider. These stockings help keep blood clots from forming in your legs.  Take showers once your health care provider approves. Until then, only take sponge baths. Pat incisions dry. Do not rub incisions with a washcloth or towel. Do not take tub baths or go swimming until your health care provider approves.  Eat foods that are high in fiber, such as raw fruits and vegetables, whole grains, beans, and nuts. Meats should be lean cut. Avoid canned, processed, and fried foods.  Drink enough fluids to keep your urine clear or pale yellow.  Weigh yourself every day. This helps identify if you are retaining fluid that may make your heart and lungs work harder.   Rest and limit activity as directed by your health care provider. You may be instructed to:  Stop any activity at once if you have chest pain, shortness of breath, irregular heartbeats, or dizziness.  Get help right away if you have any of these symptoms.  Move around frequently for short periods or take short walks as directed by your health care provider. Increase your activities gradually. You may need physical therapy or cardiac rehabilitation to help strengthen your muscles and build your endurance.  Avoid lifting, pushing, or pulling anything  heavier than 10 lb (4.5 kg) for at least 6 weeks after surgery.  Do not drive until your health care provider approves.  Ask your health care provider when you may return to work and resume sexual activity.  Follow up with your health care provider as directed.  SEEK MEDICAL CARE IF:  You have swelling, redness, increasing pain, or drainage at the site of an incision.   You develop a fever.   You have swelling in your ankles or legs.   You have pain in your legs.   You have weight gain of 2 or more pounds a day.  You are nauseous or vomit.  You have diarrhea. SEEK IMMEDIATE MEDICAL CARE IF:  You have chest pain that goes to your jaw or arms.  You have shortness of breath.   You have a fast or irregular heartbeat.   You notice a "clicking" in your breastbone (sternum) when you move.   You have numbness or weakness in your arms or legs.  You feel dizzy or lightheaded.  MAKE SURE YOU:  Understand these instructions.  Will watch your condition.  Will get help right away if you are not doing well or get worse. Document Released: 05/01/2005 Document Revised: 06/14/2013 Document Reviewed: 03/21/2013 [facility] Patient Information 2014 [place].  Endoscopic Saphenous Vein Harvesting Care After Refer to this sheet in the next few weeks. These instructions provide you with information on caring for yourself after your procedure. Your caregiver may also give you more specific instructions. Your treatment has been planned according to current medical practices, but problems sometimes occur. Call your caregiver if you have any problems or questions after your procedure. HOME CARE INSTRUCTIONS Medicine  Take whatever pain medicine your surgeon prescribes. Follow the directions carefully. Do not take over-the-counter pain medicine unless your surgeon says it is okay. Some pain medicine can cause bleeding problems for several weeks after surgery.  Follow your  surgeon's instructions about driving. You will probably not be permitted to drive after heart surgery.  Take any medicines your surgeon prescribes. Any medicines you took before your heart surgery should be checked with your caregiver before you start taking them again. Wound care  Ask your surgeon how long you should keep wearing your elastic bandage or stocking.  Check the area around your surgical cuts (incisions) whenever your bandages (dressings) are changed. Look for any redness or swelling.  You will need to return to have the stitches (sutures) or staples taken out. Ask your surgeon when to do that.  Ask your surgeon when you can shower or bathe. Activity  Try to keep your legs raised when you are sitting.  Do any exercises your caregivers have given you. These may include deep breathing exercises, coughing, walking, or other exercises. SEEK MEDICAL CARE IF:  You have any questions about your medicines.  You have more leg pain, especially if your pain medicine stops working.  New or growing bruises develop on your leg.  Your leg swells, feels tight, or becomes red.  You have numbness in your leg. SEEK IMMEDIATE MEDICAL CARE IF:  Your pain gets much worse.  Blood or fluid leaks  from any of the incisions.  Your incisions become warm, swollen, or red.  You have chest pain.  You have trouble breathing.  You have a fever.  You have more pain near your leg incision. MAKE SURE YOU:  Understand these instructions.  Will watch your condition.  Will get help right away if you are not doing well or get worse. Document Released: 06/24/2011 Document Revised: 01/04/2012 Document Reviewed: 06/24/2011 [facility] Patient Information 2014 [place], [place].

## 2014-04-09 NOTE — Progress Notes (Addendum)
      [address]       [place] [address]             [phone]        5 Days Post-Op Procedure(s) (LRB): Coronary artery bypass graft times three using left internal mammary artery and right leg greater saphenous vein. (N/A) INTRAOPERATIVE TRANSESOPHAGEAL ECHOCARDIOGRAM (N/A)  Subjective: Patient felt a little click in upper sternum earlier this am. Otherwise, no complaints. He really wants to go home.  Objective: Vital signs in last 24 hours: Temp:  [98.2 F (36.8 C)-99.1 F (37.3 C)] 98.9 F (37.2 C) (06/15 0435) Pulse Rate:  [73-86] 73 (06/15 0435) Cardiac Rhythm:  [-] Normal sinus rhythm (06/14 1945) Resp:  [16-20] 18 (06/15 0435) BP: (96-136)/(51-72) 107/72 mmHg (06/15 0435) SpO2:  [95 %-99 %] 99 % (06/15 0435) Weight:  [334 lb 11.2 oz (151.819 kg)] 334 lb 11.2 oz (151.819 kg) (06/15 0435)  Pre op weight 156 kg Current Weight  04/09/14 334 lb 11.2 oz (151.819 kg)      Intake/Output from previous day: 06/14 0701 - 06/15 0700 In: 400 [P.O.:400] Out: 900 [Urine:900]   Physical Exam:  Cardiovascular: RRR Pulmonary: Slightly diminished at bases; no rales, wheezes, or rhonchi. Abdomen: Soft, non tender, obese, bowel sounds present. Extremities: Mild bilateral lower extremity edema. Wounds: Sternal wound is clean and dry.  No erythema or signs of infection. Bloody ooze from previous drain site of  RLE. Sternum does not appear unstable  Lab Results: CBC:  Recent Labs  04/07/14 0420 04/08/14 0341  WBC 14.1* 12.4*  HGB 11.3* 11.0*  HCT 34.0* 33.6*  PLT 184 202   BMET:   Recent Labs  04/07/14 0420  NA 135*  K 3.7  CL 95*  CO2 24  GLUCOSE 98  BUN 26*  CREATININE 0.98  CALCIUM 8.6    PT/INR:  Lab Results  Component Value Date   INR 1.32 04/04/2014   INR 0.98 04/03/2014   ABG:  INR: Will add last result for INR, ABG once components are confirmed Will add last 4 CBG results once components are  confirmed  Assessment/Plan:  1. CV - S/p NSTEMI. SR in the 80's. On Lopressor 50 bid and Lisinopril 5 daily 2.  Pulmonary - Encourage incentive spirometer and flutter valve. 3. Volume Overload - On Lasix 40 bid. Will decrease to daily today. Is below pre op weight. Still with some LE edema. Will discuss with surgeon if to continue at discharge  4.  Acute blood loss anemia - H and H sable at 11 and 33.6 5. Remove sutures 6. Possible discharge   ZIMMERMAN,DONIELLE MPA-C 04/09/2014,7:39 AM      I have seen and examined the patient and agree with the assessment and plan as outlined.  Ready for d/c home.  Stop lasix.  OWEN,CLARENCE H 04/09/2014 8:43 AM

## 2014-04-09 NOTE — Progress Notes (Signed)
Patient discharged to home.  Patient alert, oriented, verbally responsive, breathing regular and non-labored throughout, no s/s of distress noted throughout, no c/o pain throughout.  Discharge instructions thoroughly verbalized to patient, son, and, daughter at bedside.  All verbalized understanding throughout.  Sutures from chest tube removed.  Patient left unit per wheelchair accompanied by volunteer and family.  VS WNL.  Dirk Dress 04/09/2014 12:06 PM

## 2014-04-09 NOTE — Progress Notes (Signed)
Has been walking independently. Ed completed focusing on ex, diet change, smoking cessation. Pt feels overwhelmed but motivated. Excited about [facility] in [place]. Will send referral. [phone] Yves Dill CES, ACSM 9:54 AM 04/09/2014

## 2014-04-16 ENCOUNTER — Other Ambulatory Visit: Payer: Self-pay | Admitting: *Deleted

## 2014-04-16 DIAGNOSIS — G8918 Other acute postprocedural pain: Secondary | ICD-10-CM

## 2014-04-16 MED ORDER — TRAMADOL HCL 50 MG PO TABS: 100.0000 mg | ORAL_TABLET | Freq: Four times a day (QID) | ORAL | Status: DC | PRN

## 2014-05-04 ENCOUNTER — Other Ambulatory Visit: Payer: Self-pay | Admitting: Thoracic Surgery (Cardiothoracic Vascular Surgery)

## 2014-05-04 DIAGNOSIS — I214 Non-ST elevation (NSTEMI) myocardial infarction: Secondary | ICD-10-CM

## 2014-05-07 ENCOUNTER — Ambulatory Visit
Admission: RE | Admit: 2014-05-07 | Discharge: 2014-05-07 | Disposition: A | Discharge status: Home / Self Care | Payer: BC Managed Care – PPO | Source: Ambulatory Visit | Attending: Thoracic Surgery (Cardiothoracic Vascular Surgery) | Admitting: Thoracic Surgery (Cardiothoracic Vascular Surgery)

## 2014-05-07 ENCOUNTER — Ambulatory Visit (INDEPENDENT_AMBULATORY_CARE_PROVIDER_SITE_OTHER): Payer: Self-pay | Admitting: Physician Assistant

## 2014-05-07 VITALS — BP 152/79 | HR 71 | Resp 16 | Ht 72.0 in | Wt 322.0 lb

## 2014-05-07 DIAGNOSIS — Z951 Presence of aortocoronary bypass graft: Secondary | ICD-10-CM

## 2014-05-07 DIAGNOSIS — I214 Non-ST elevation (NSTEMI) myocardial infarction: Secondary | ICD-10-CM

## 2014-05-07 DIAGNOSIS — I2510 Atherosclerotic heart disease of native coronary artery without angina pectoris: Secondary | ICD-10-CM

## 2014-05-07 DIAGNOSIS — I2584 Coronary atherosclerosis due to calcified coronary lesion: Secondary | ICD-10-CM

## 2014-05-07 MED ORDER — OXYCODONE HCL 5 MG PO TABS: 5.0000 mg | ORAL_TABLET | ORAL | Status: DC | PRN

## 2014-05-07 NOTE — Progress Notes (Signed)
[address]       [place] [address]             [phone]          HPI: Patient returns for routine postoperative follow-up having undergone CABG x 3 by Dr. Roxy Manns on 04/05/2014 using right leg vein and LIMA.  His left radial artery was explored but was not suitable for use. The patient's postoperative course was generally uneventful and he was discharged home on 04/09/2014 in good condition.  Since hospital discharge, the patient has continued to progress well.  He has been walking daily and plans to begin cardiac rehab next week.  He denies any shortness of breath or chest pain, and mainly has taken pain medications at night for sleep.  He has an appointment to see his cardiologist tomorrow. He has no specific complaints today, and states "I feel the best I've felt in 10 years."    Current Outpatient Prescriptions  Medication Sig Dispense Refill  . aspirin EC 325 MG EC tablet Take 1 tablet (325 mg total) by mouth daily.  30 tablet  0  . atorvastatin (LIPITOR) 80 MG tablet Take 1 tablet (80 mg total) by mouth daily at 6 PM.  30 tablet  1  . Cyanocobalamin (B-12 PO) Take 1 tablet by mouth daily.      Marland Kitchen lisinopril (PRINIVIL,ZESTRIL) 5 MG tablet Take 1 tablet (5 mg total) by mouth daily.  30 tablet  1  . metoprolol (LOPRESSOR) 50 MG tablet Take 1 tablet (50 mg total) by mouth 2 (two) times daily.  60 tablet  1  . oxyCODONE (OXY IR/ROXICODONE) 5 MG immediate release tablet Take 1-2 tablets (5-10 mg total) by mouth every 4 (four) hours as needed for moderate pain.  30 tablet  0   No current facility-administered medications for this visit.     Physical Exam: BP 152/79 HR 71 Resp 16 Wounds: Sternal wound, chest tube sites, right leg EVH sites and left arm incision all healing well without erythema or drainage.  Sternum is stable. Heart: regular rate and rhythm Lungs: Clear Extremities: Mild RLE edema   Diagnostic Tests: Chest xray: Dg Chest 2  View  05/07/2014   CLINICAL DATA:  Coronary artery disease post MI and CABG, history smoking, hypertension, melanoma, hypercholesterolemia,  EXAM: CHEST  2 VIEW  COMPARISON:  04/07/2014  FINDINGS: Minimal enlargement of cardiac silhouette post CABG.  Mediastinal contours and pulmonary vascularity normal.  Mild bronchitic changes without infiltrate, pleural effusion or pneumothorax.  Bones unremarkable.  IMPRESSION: Enlargement of cardiac silhouette post CABG.  Bronchitic changes.   Electronically Signed   By: Lavonia Dana M.D.   On: 05/07/2014 13:20       Assessment/Plan: The patient has recovered well from CABG x 3.  His blood pressure is a little elevated on today's exam, but I will defer any adjustments to his medications until he sees his cardiologist tomorrow.  I have encouraged him to proceed with cardiac rehab as planned.  He may increase his activity as tolerated and may begin driving.  I did refill his Oxy IR #30 today, as he still takes it periodically at night.  We discussed his return to work, and because his job involves regularly lifting greater than 75 lbs without any option for light duty, I have instructed him to remain out of work for a full 3 months postop.  He is doing well from a surgical standpoint, and we  will see him back as needed for further follow up.

## 2014-05-08 ENCOUNTER — Encounter: Payer: Self-pay | Admitting: Nurse Practitioner

## 2014-05-08 ENCOUNTER — Ambulatory Visit (INDEPENDENT_AMBULATORY_CARE_PROVIDER_SITE_OTHER): Payer: BC Managed Care – PPO | Admitting: Nurse Practitioner

## 2014-05-08 VITALS — BP 130/70 | HR 64 | Ht 72.0 in | Wt 326.4 lb

## 2014-05-08 DIAGNOSIS — Z720 Tobacco use: Secondary | ICD-10-CM

## 2014-05-08 DIAGNOSIS — Z951 Presence of aortocoronary bypass graft: Secondary | ICD-10-CM

## 2014-05-08 DIAGNOSIS — F17200 Nicotine dependence, unspecified, uncomplicated: Secondary | ICD-10-CM

## 2014-05-08 DIAGNOSIS — I10 Essential (primary) hypertension: Secondary | ICD-10-CM

## 2014-05-08 LAB — BASIC METABOLIC PANEL
BUN: 9 mg/dL (ref 6–23)
CO2: 28 mEq/L (ref 19–32)
Calcium: 9.7 mg/dL (ref 8.4–10.5)
Chloride: 103 mEq/L (ref 96–112)
Creatinine, Ser: 0.9 mg/dL (ref 0.4–1.5)
GFR: 99.06 mL/min (ref >60.00)
Glucose, Bld: 82 mg/dL (ref 70–99)
Potassium: 4.2 mEq/L (ref 3.5–5.1)
Sodium: 138 mEq/L (ref 135–145)

## 2014-05-08 LAB — HEPATIC FUNCTION PANEL
ALT: 20 U/L (ref 0–53)
AST: 19 U/L (ref 0–37)
Albumin: 3.8 g/dL (ref 3.5–5.2)
Alkaline Phosphatase: 110 U/L (ref 39–117)
Bilirubin, Direct: 0.1 mg/dL (ref 0.0–0.3)
Total Bilirubin: 0.6 mg/dL (ref 0.2–1.2)
Total Protein: 7.6 g/dL (ref 6.0–8.3)

## 2014-05-08 LAB — CBC
HCT: 41.4 % (ref 39.0–52.0)
Hemoglobin: 13.6 g/dL (ref 13.0–17.0)
MCHC: 32.9 g/dL (ref 30.0–36.0)
MCV: 85.1 fl (ref 78.0–100.0)
Platelets: 258 10*3/uL (ref 150.0–400.0)
RBC: 4.86 Mil/uL (ref 4.22–5.81)
RDW: 16.3 % — ABNORMAL HIGH (ref 11.5–15.5)
WBC: 6.8 10*3/uL (ref 4.0–10.5)

## 2014-05-08 LAB — LIPID PANEL
Cholesterol: 133 mg/dL (ref 0–200)
HDL: 26.5 mg/dL — ABNORMAL LOW (ref >39.00)
LDL Cholesterol: 64 mg/dL (ref 0–99)
NonHDL: 106.5
Total CHOL/HDL Ratio: 5
Triglycerides: 213 mg/dL — ABNORMAL HIGH (ref 0.0–149.0)
VLDL: 42.6 mg/dL — ABNORMAL HIGH (ref 0.0–40.0)

## 2014-05-08 NOTE — Progress Notes (Signed)
Fernando Schmidt Date of Birth: 09-19-1966 Medical Record #[id number]  History of Present Illness: Mr. Carlyon is seen back today for a post hospital visit. Seen for Dr. Burt Knack. He is a 48 year old morbidly obese male with no prior history of CAD but with HTN, HLD, long standing tobacco abuse and a strong family history of CAD.  Most recently with increasing chest pain - came to ER per EMS - was cathed and had severe 3 vessel disease - s/p CABG x 3 with LIMA to LAD, SVG to PD, and SVG to OM. Post op course uneventful. EF was normal at time of his cath.   Comes in today. Here alone. He is a month post op. No real problems. Going to rehab at [facility]. Doing ok. No chest pain. Some soreness. Still using some pain medicine. Wanting to return to work - his job involves heavy lifting - this has already been discouraged. Having trouble sexually. Smoking 1/2 pak per day - this is less. No fever or chills. Released from surgery. BP ok today. Was up yesterday at [facility] but he admits he was pretty anxious.    Current Outpatient Prescriptions  Medication Sig Dispense Refill  . aspirin EC 325 MG EC tablet Take 1 tablet (325 mg total) by mouth daily.  30 tablet  0  . atorvastatin (LIPITOR) 80 MG tablet Take 1 tablet (80 mg total) by mouth daily at 6 PM.  30 tablet  1  . Cyanocobalamin (B-12 PO) Take 1 tablet by mouth daily.      Marland Kitchen lisinopril (PRINIVIL,ZESTRIL) 5 MG tablet Take 1 tablet (5 mg total) by mouth daily.  30 tablet  1  . metoprolol (LOPRESSOR) 50 MG tablet Take 1 tablet (50 mg total) by mouth 2 (two) times daily.  60 tablet  1  . oxyCODONE (OXY IR/ROXICODONE) 5 MG immediate release tablet Take 1-2 tablets (5-10 mg total) by mouth every 4 (four) hours as needed for moderate pain.  30 tablet  0   No current facility-administered medications for this visit.    Allergies  Allergen Reactions  . Bee Venom Anaphylaxis  . Cauliflower [Brassica Oleracea Italica] Anaphylaxis  . Cortisone Other (See  Comments)    Chest pain    Past Medical History  Diagnosis Date  . Hypertension   . High cholesterol   . Morbid obesity   . Tobacco abuse   . NSTEMI (non-ST elevated myocardial infarction) 04/03/2014  . Coronary artery disease 04/03/2014  . Arthritis   . Melanoma   . 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 Back surgery    . Hip surgery Left     melanoma excision  . Total ankle replacement Left   . Shoulder surgery Right   . Knee arthroscopy Left   . Coronary artery bypass graft N/A 04/04/2014    Procedure: Coronary artery bypass graft times three using left internal mammary artery and right leg greater saphenous vein.;  Surgeon: Rexene Alberts, MD;  Location: [facility];  Service: Open Heart Surgery;  Laterality: N/A;  . Intraoperative transesophageal echocardiogram N/A 04/04/2014    Procedure: INTRAOPERATIVE TRANSESOPHAGEAL ECHOCARDIOGRAM;  Surgeon: Rexene Alberts, MD;  Location: [facility];  Service: Open Heart Surgery;  Laterality: N/A;    History  Smoking status  . Current Every Day Smoker -- 2.00  packs/day for 30 years  . Types: Cigarettes  Smokeless tobacco  . Never Used    History  Alcohol Use  . Yes    Comment: occassional    Family History  Problem Relation Age of Onset  . Diabetes Mother     Review of Systems: The review of systems is per the HPI.  All other systems were reviewed and are negative.  Physical Exam: BP 130/70  Pulse 64  Ht 6' (1.829 m)  Wt 326 lb 6.4 oz (148.054 kg)  BMI 44.26 kg/m2 Patient is alert and in no acute distress. He is obese. Weight is coming down. Skin is warm and dry. Color is normal.  HEENT is unremarkable. Normocephalic/atraumatic. PERRL. Sclera are nonicteric. Neck is supple. No masses. No JVD. Lungs are clear. Cardiac exam shows a regular rate and rhythm. Sternum looks ok. Little delayed healing from his chest tube sites but  ok overall. Abdomen is obese but soft. Extremities are without edema. Gait and ROM are intact. No gross neurologic deficits noted.  Wt Readings from Last 3 Encounters:  05/08/14 326 lb 6.4 oz (148.054 kg)  05/07/14 322 lb (146.058 kg)  04/09/14 334 lb 11.2 oz (151.819 kg)    LABORATORY DATA/PROCEDURES: PENDING  Lab Results  Component Value Date   WBC 12.4* 04/08/2014   HGB 11.0* 04/08/2014   HCT 33.6* 04/08/2014   PLT 202 04/08/2014   GLUCOSE 98 04/07/2014   CHOL 198 04/04/2014   TRIG 298* 04/04/2014   HDL 18* 04/04/2014   LDLCALC 120* 04/04/2014   ALT 21 04/03/2014   AST 23 04/03/2014   NA 135* 04/07/2014   K 3.7 04/07/2014   CL 95* 04/07/2014   CREATININE 0.98 04/07/2014   BUN 26* 04/07/2014   CO2 24 04/07/2014   TSH 1.270 04/03/2014   INR 1.32 04/04/2014   HGBA1C 5.1 04/03/2014    BNP (last 3 results)  Recent Labs  04/03/14 1410  PROBNP 38.9      Lab Results  Component Value Date   TROPONINI 3.28* 04/04/2014    Assessment / Plan: 1. NSTEMI - s/p cath with subsequent CABG x 3. Good progress. Reminded again about need to restrict lifting.  2. HTN - BP ok  3. HLD - on statin  4. Erectile dysfunction - most likely due to beta blocker - he would like to stay on his current regimen. May be a candidate for ED drug but would defer until return visit.   5. Tobacco abuse - smoking less - total cessation encouraged. I do not get the impression that he will be able to modify long term.   Patient is agreeable to this plan and will call if any problems develop in the interim.   Burtis Junes, RN, [place] [address] [place], [place]  [address] [phone]

## 2014-05-08 NOTE — Patient Instructions (Addendum)
We will check labs today  Walking - up to an hour - every day  Remember your lifting restrictions - you may add 10 pounds for every month out from your surgery  See Dr. Burt Knack in 3 months  Stop smoking  Call the [place] office at [phone] if you have any questions, problems or concerns.

## 2014-05-10 ENCOUNTER — Other Ambulatory Visit: Payer: Self-pay

## 2014-05-10 MED ORDER — METOPROLOL TARTRATE 50 MG PO TABS: 50.0000 mg | ORAL_TABLET | Freq: Two times a day (BID) | ORAL | Status: DC

## 2014-05-10 MED ORDER — LISINOPRIL 5 MG PO TABS: 5.0000 mg | ORAL_TABLET | Freq: Every day | ORAL | Status: DC

## 2014-05-10 MED ORDER — ATORVASTATIN CALCIUM 80 MG PO TABS: 80.0000 mg | ORAL_TABLET | Freq: Every day | ORAL | Status: DC

## 2014-05-15 ENCOUNTER — Encounter (HOSPITAL_COMMUNITY): Payer: Self-pay

## 2014-05-15 ENCOUNTER — Encounter (HOSPITAL_COMMUNITY)
Admission: RE | Admit: 2014-05-15 | Discharge: 2014-05-15 | Disposition: A | Discharge status: Home / Self Care | Payer: BC Managed Care – PPO | Source: Ambulatory Visit | Attending: Cardiovascular Disease | Admitting: Cardiovascular Disease

## 2014-05-15 VITALS — BP 110/70 | HR 61 | Ht 72.0 in | Wt 317.0 lb

## 2014-05-15 DIAGNOSIS — I219 Acute myocardial infarction, unspecified: Secondary | ICD-10-CM | POA: Insufficient documentation

## 2014-05-15 DIAGNOSIS — Z5189 Encounter for other specified aftercare: Secondary | ICD-10-CM | POA: Insufficient documentation

## 2014-05-15 DIAGNOSIS — Z951 Presence of aortocoronary bypass graft: Secondary | ICD-10-CM

## 2014-05-15 DIAGNOSIS — I214 Non-ST elevation (NSTEMI) myocardial infarction: Secondary | ICD-10-CM

## 2014-05-15 NOTE — Patient Instructions (Signed)
Pt has finished orientation and is scheduled to start CR on 05/21/14 at 8:15. Pt has been instructed to arrive to class 15 minutes early for scheduled class. Pt has been instructed to wear comfortable clothing and shoes with rubber soles. Pt has been told to take their medications 1 hour prior to coming to class.  If the patient is not going to attend class, he/she has been instructed to call.

## 2014-05-15 NOTE — Progress Notes (Signed)
Patient referred to CR due to CABGx3 V45.81 and NSTEMI 410.0. During orientation advised patient on arrival and appointment times what to wear, what to do before, during and after exercise. Reviewed attendance and class policy. Talked about inclement weather and class consultation policy. Pt is scheduled to start Cardiac Rehab on 05/21/14 at 8:15. Pt was advised to come to class 5 minutes before class starts. He was also given instructions on meeting with the dietician and attending the Family Structure classes. Pt is eager to get started. Patient was able to complete 6 minute walk test.

## 2014-05-21 ENCOUNTER — Encounter (HOSPITAL_COMMUNITY)
Admission: RE | Admit: 2014-05-21 | Discharge: 2014-05-21 | Disposition: A | Discharge status: Home / Self Care | Payer: BC Managed Care – PPO | Source: Ambulatory Visit | Attending: Cardiovascular Disease | Admitting: Cardiovascular Disease

## 2014-05-21 DIAGNOSIS — I219 Acute myocardial infarction, unspecified: Secondary | ICD-10-CM | POA: Diagnosis not present

## 2014-05-21 DIAGNOSIS — Z951 Presence of aortocoronary bypass graft: Secondary | ICD-10-CM | POA: Diagnosis not present

## 2014-05-21 DIAGNOSIS — Z5189 Encounter for other specified aftercare: Secondary | ICD-10-CM | POA: Diagnosis present

## 2014-05-23 ENCOUNTER — Encounter (HOSPITAL_COMMUNITY)
Admission: RE | Admit: 2014-05-23 | Discharge: 2014-05-23 | Disposition: A | Discharge status: Home / Self Care | Payer: BC Managed Care – PPO | Source: Ambulatory Visit | Attending: Cardiovascular Disease | Admitting: Cardiovascular Disease

## 2014-05-23 DIAGNOSIS — Z5189 Encounter for other specified aftercare: Secondary | ICD-10-CM | POA: Diagnosis not present

## 2014-05-25 ENCOUNTER — Encounter (HOSPITAL_COMMUNITY): Payer: BC Managed Care – PPO

## 2014-05-28 ENCOUNTER — Encounter (HOSPITAL_COMMUNITY)
Admission: RE | Admit: 2014-05-28 | Discharge: 2014-05-28 | Disposition: A | Discharge status: Home / Self Care | Payer: BC Managed Care – PPO | Source: Ambulatory Visit | Attending: Cardiovascular Disease | Admitting: Cardiovascular Disease

## 2014-05-28 DIAGNOSIS — Z951 Presence of aortocoronary bypass graft: Secondary | ICD-10-CM | POA: Diagnosis not present

## 2014-05-28 DIAGNOSIS — Z5189 Encounter for other specified aftercare: Secondary | ICD-10-CM | POA: Diagnosis present

## 2014-05-28 DIAGNOSIS — I219 Acute myocardial infarction, unspecified: Secondary | ICD-10-CM | POA: Insufficient documentation

## 2014-05-30 ENCOUNTER — Encounter (HOSPITAL_COMMUNITY)
Admission: RE | Admit: 2014-05-30 | Discharge: 2014-05-30 | Disposition: A | Discharge status: Home / Self Care | Payer: BC Managed Care – PPO | Source: Ambulatory Visit | Attending: Cardiovascular Disease | Admitting: Cardiovascular Disease

## 2014-05-30 DIAGNOSIS — Z5189 Encounter for other specified aftercare: Secondary | ICD-10-CM | POA: Diagnosis not present

## 2014-06-01 ENCOUNTER — Encounter (HOSPITAL_COMMUNITY)
Admission: RE | Admit: 2014-06-01 | Discharge: 2014-06-01 | Disposition: A | Discharge status: Home / Self Care | Payer: BC Managed Care – PPO | Source: Ambulatory Visit | Attending: Cardiovascular Disease | Admitting: Cardiovascular Disease

## 2014-06-01 DIAGNOSIS — Z5189 Encounter for other specified aftercare: Secondary | ICD-10-CM | POA: Diagnosis not present

## 2014-06-04 ENCOUNTER — Encounter (HOSPITAL_COMMUNITY)
Admission: RE | Admit: 2014-06-04 | Discharge: 2014-06-04 | Disposition: A | Discharge status: Home / Self Care | Payer: BC Managed Care – PPO | Source: Ambulatory Visit | Attending: Cardiovascular Disease | Admitting: Cardiovascular Disease

## 2014-06-04 DIAGNOSIS — Z5189 Encounter for other specified aftercare: Secondary | ICD-10-CM | POA: Diagnosis not present

## 2014-06-06 ENCOUNTER — Encounter (HOSPITAL_COMMUNITY)
Admission: RE | Admit: 2014-06-06 | Discharge: 2014-06-06 | Disposition: A | Discharge status: Home / Self Care | Payer: BC Managed Care – PPO | Source: Ambulatory Visit | Attending: Cardiovascular Disease | Admitting: Cardiovascular Disease

## 2014-06-06 DIAGNOSIS — Z5189 Encounter for other specified aftercare: Secondary | ICD-10-CM | POA: Diagnosis not present

## 2014-06-08 ENCOUNTER — Encounter (HOSPITAL_COMMUNITY)
Admission: RE | Admit: 2014-06-08 | Discharge: 2014-06-08 | Disposition: A | Discharge status: Home / Self Care | Payer: BC Managed Care – PPO | Source: Ambulatory Visit | Attending: Cardiovascular Disease | Admitting: Cardiovascular Disease

## 2014-06-08 DIAGNOSIS — Z5189 Encounter for other specified aftercare: Secondary | ICD-10-CM | POA: Diagnosis not present

## 2014-06-11 ENCOUNTER — Encounter (HOSPITAL_COMMUNITY)
Admission: RE | Admit: 2014-06-11 | Discharge: 2014-06-11 | Disposition: A | Discharge status: Home / Self Care | Payer: BC Managed Care – PPO | Source: Ambulatory Visit | Attending: Cardiovascular Disease | Admitting: Cardiovascular Disease

## 2014-06-11 DIAGNOSIS — Z5189 Encounter for other specified aftercare: Secondary | ICD-10-CM | POA: Diagnosis not present

## 2014-06-13 ENCOUNTER — Other Ambulatory Visit: Payer: Self-pay | Admitting: *Deleted

## 2014-06-13 ENCOUNTER — Encounter (HOSPITAL_COMMUNITY)
Admission: RE | Admit: 2014-06-13 | Discharge: 2014-06-13 | Disposition: A | Discharge status: Home / Self Care | Payer: BC Managed Care – PPO | Source: Ambulatory Visit | Attending: Cardiovascular Disease | Admitting: Cardiovascular Disease

## 2014-06-13 DIAGNOSIS — Z5189 Encounter for other specified aftercare: Secondary | ICD-10-CM | POA: Diagnosis not present

## 2014-06-13 MED ORDER — METOPROLOL TARTRATE 50 MG PO TABS: 50.0000 mg | ORAL_TABLET | Freq: Two times a day (BID) | ORAL | Status: DC

## 2014-06-13 MED ORDER — LISINOPRIL 5 MG PO TABS
5.0000 mg | ORAL_TABLET | Freq: Every day | ORAL | Status: DC
Start: 2014-06-13 — End: 2014-08-07

## 2014-06-15 ENCOUNTER — Encounter (HOSPITAL_COMMUNITY)
Admission: RE | Admit: 2014-06-15 | Discharge: 2014-06-15 | Disposition: A | Discharge status: Home / Self Care | Payer: BC Managed Care – PPO | Source: Ambulatory Visit | Attending: Cardiovascular Disease | Admitting: Cardiovascular Disease

## 2014-06-15 DIAGNOSIS — Z5189 Encounter for other specified aftercare: Secondary | ICD-10-CM | POA: Diagnosis not present

## 2014-06-18 ENCOUNTER — Encounter (HOSPITAL_COMMUNITY)
Admission: RE | Admit: 2014-06-18 | Discharge: 2014-06-18 | Disposition: A | Discharge status: Home / Self Care | Payer: BC Managed Care – PPO | Source: Ambulatory Visit | Attending: Cardiovascular Disease | Admitting: Cardiovascular Disease

## 2014-06-18 DIAGNOSIS — Z5189 Encounter for other specified aftercare: Secondary | ICD-10-CM | POA: Diagnosis not present

## 2014-06-20 ENCOUNTER — Encounter (HOSPITAL_COMMUNITY)
Admission: RE | Admit: 2014-06-20 | Discharge: 2014-06-20 | Disposition: A | Discharge status: Home / Self Care | Payer: BC Managed Care – PPO | Source: Ambulatory Visit | Attending: Cardiovascular Disease | Admitting: Cardiovascular Disease

## 2014-06-20 DIAGNOSIS — Z5189 Encounter for other specified aftercare: Secondary | ICD-10-CM | POA: Diagnosis not present

## 2014-06-22 ENCOUNTER — Encounter (HOSPITAL_COMMUNITY)
Admission: RE | Admit: 2014-06-22 | Discharge: 2014-06-22 | Disposition: A | Discharge status: Home / Self Care | Payer: BC Managed Care – PPO | Source: Ambulatory Visit | Attending: Cardiovascular Disease | Admitting: Cardiovascular Disease

## 2014-06-22 DIAGNOSIS — Z5189 Encounter for other specified aftercare: Secondary | ICD-10-CM | POA: Diagnosis not present

## 2014-06-25 ENCOUNTER — Encounter (HOSPITAL_COMMUNITY)
Admission: RE | Admit: 2014-06-25 | Discharge: 2014-06-25 | Disposition: A | Discharge status: Home / Self Care | Payer: BC Managed Care – PPO | Source: Ambulatory Visit | Attending: Cardiovascular Disease | Admitting: Cardiovascular Disease

## 2014-06-25 DIAGNOSIS — Z5189 Encounter for other specified aftercare: Secondary | ICD-10-CM | POA: Diagnosis not present

## 2014-06-27 ENCOUNTER — Encounter (HOSPITAL_COMMUNITY)
Admission: RE | Admit: 2014-06-27 | Discharge: 2014-06-27 | Disposition: A | Discharge status: Home / Self Care | Payer: BC Managed Care – PPO | Source: Ambulatory Visit | Attending: Cardiovascular Disease | Admitting: Cardiovascular Disease

## 2014-06-27 DIAGNOSIS — Z951 Presence of aortocoronary bypass graft: Secondary | ICD-10-CM | POA: Diagnosis not present

## 2014-06-27 DIAGNOSIS — Z5189 Encounter for other specified aftercare: Secondary | ICD-10-CM | POA: Diagnosis present

## 2014-06-27 DIAGNOSIS — I219 Acute myocardial infarction, unspecified: Secondary | ICD-10-CM | POA: Insufficient documentation

## 2014-06-29 ENCOUNTER — Encounter (HOSPITAL_COMMUNITY): Payer: BC Managed Care – PPO

## 2014-07-02 ENCOUNTER — Encounter (HOSPITAL_COMMUNITY): Payer: BC Managed Care – PPO

## 2014-07-04 ENCOUNTER — Encounter (HOSPITAL_COMMUNITY)
Admission: RE | Admit: 2014-07-04 | Discharge: 2014-07-04 | Disposition: A | Discharge status: Home / Self Care | Payer: BC Managed Care – PPO | Source: Ambulatory Visit | Attending: Cardiovascular Disease | Admitting: Cardiovascular Disease

## 2014-07-04 DIAGNOSIS — Z5189 Encounter for other specified aftercare: Secondary | ICD-10-CM | POA: Diagnosis not present

## 2014-07-06 ENCOUNTER — Encounter (HOSPITAL_COMMUNITY): Payer: BC Managed Care – PPO

## 2014-07-09 ENCOUNTER — Encounter (HOSPITAL_COMMUNITY)
Admission: RE | Admit: 2014-07-09 | Discharge: 2014-07-09 | Disposition: A | Discharge status: Home / Self Care | Payer: BC Managed Care – PPO | Source: Ambulatory Visit | Attending: Cardiovascular Disease | Admitting: Cardiovascular Disease

## 2014-07-09 DIAGNOSIS — Z5189 Encounter for other specified aftercare: Secondary | ICD-10-CM | POA: Diagnosis not present

## 2014-07-11 ENCOUNTER — Encounter (HOSPITAL_COMMUNITY): Payer: BC Managed Care – PPO

## 2014-07-13 ENCOUNTER — Encounter (HOSPITAL_COMMUNITY): Payer: BC Managed Care – PPO

## 2014-07-16 ENCOUNTER — Encounter (HOSPITAL_COMMUNITY): Payer: BC Managed Care – PPO

## 2014-07-18 ENCOUNTER — Encounter (HOSPITAL_COMMUNITY): Payer: BC Managed Care – PPO

## 2014-07-20 ENCOUNTER — Encounter (HOSPITAL_COMMUNITY): Payer: BC Managed Care – PPO

## 2014-07-23 ENCOUNTER — Encounter (HOSPITAL_COMMUNITY): Payer: BC Managed Care – PPO

## 2014-07-25 ENCOUNTER — Encounter (HOSPITAL_COMMUNITY): Payer: BC Managed Care – PPO

## 2014-07-27 ENCOUNTER — Encounter (HOSPITAL_COMMUNITY): Payer: BC Managed Care – PPO

## 2014-07-30 ENCOUNTER — Encounter (HOSPITAL_COMMUNITY): Payer: BC Managed Care – PPO

## 2014-08-01 ENCOUNTER — Encounter (HOSPITAL_COMMUNITY): Payer: BC Managed Care – PPO

## 2014-08-03 ENCOUNTER — Encounter (HOSPITAL_COMMUNITY): Payer: BC Managed Care – PPO

## 2014-08-06 ENCOUNTER — Encounter (HOSPITAL_COMMUNITY): Payer: BC Managed Care – PPO

## 2014-08-07 ENCOUNTER — Other Ambulatory Visit: Payer: Self-pay | Admitting: Cardiovascular Disease

## 2014-08-08 ENCOUNTER — Encounter (HOSPITAL_COMMUNITY): Payer: BC Managed Care – PPO

## 2014-08-10 ENCOUNTER — Encounter (HOSPITAL_COMMUNITY): Payer: BC Managed Care – PPO

## 2014-08-13 ENCOUNTER — Encounter: Payer: Self-pay | Admitting: Thoracic Surgery (Cardiothoracic Vascular Surgery)

## 2014-08-13 ENCOUNTER — Ambulatory Visit (INDEPENDENT_AMBULATORY_CARE_PROVIDER_SITE_OTHER): Payer: BC Managed Care – PPO | Admitting: Thoracic Surgery (Cardiothoracic Vascular Surgery)

## 2014-08-13 VITALS — BP 142/73 | HR 65 | Ht 72.0 in | Wt 317.0 lb

## 2014-08-13 DIAGNOSIS — I2584 Coronary atherosclerosis due to calcified coronary lesion: Secondary | ICD-10-CM

## 2014-08-13 DIAGNOSIS — I2510 Atherosclerotic heart disease of native coronary artery without angina pectoris: Secondary | ICD-10-CM

## 2014-08-13 DIAGNOSIS — Z951 Presence of aortocoronary bypass graft: Secondary | ICD-10-CM

## 2014-08-13 NOTE — Progress Notes (Signed)
      [address]       [place] [address]             [phone]     CARDIOTHORACIC SURGERY OFFICE NOTE  Referring Provider is Sherren Mocha, MD PCP is Velta Addison, Claretha Cooper, DO   HPI:  Patient returns for routine followup now 4 months status post coronary artery bypass grafting x3 for severe three-vessel coronary artery disease, status post acute non-ST segment elevation myocardial infarction. The patient's early postoperative recovery was uncomplicated and he was last seen here in our office 05/07/2014. Since then he has continued to do well. He is now back at work and he states that he feels much better than he did prior to surgery. He participated in the outpatient cardiac rehabilitation program at [facility].  He denies any symptoms of exertional chest pain or shortness of breath. Unfortunately he has gone back to smoking, but he states that he has cut back from 3 packs a day to one pack per day.  He states that he has lost proximally 40 pounds in weight. He has been making an effort to watch his diet. He reports some mild constipation and he wonders whether or not any of his current medications could be causing it.   Current Outpatient Prescriptions  Medication Sig Dispense Refill  . aspirin EC 325 MG EC tablet Take 1 tablet (325 mg total) by mouth daily.  30 tablet  0  . atorvastatin (LIPITOR) 80 MG tablet Take 1 tablet (80 mg total) by mouth daily at 6 PM.  30 tablet  3  . Cyanocobalamin (B-12 PO) Take 1 tablet by mouth daily.      Marland Kitchen lisinopril (PRINIVIL,ZESTRIL) 5 MG tablet TAKE 1 TABLET BY MOUTH ONCE DAILY  30 tablet  5  . metoprolol (LOPRESSOR) 50 MG tablet TAKE (1) TABLET TWICE A DAY.  60 tablet  5   No current facility-administered medications for this visit.      Physical Exam:   BP 142/73  Pulse 65  Ht 6' (1.829 m)  Wt 317 lb (143.79 kg)  BMI 42.98 kg/m2  SpO2 95%  General:  Well-appearing  Chest:   Clear  CV:   Regular rate and rhythm without  murmur  Incisions:  Completely healed, sternum is stable  Abdomen:  Soft nontender  Extremities:  Warm and well-perfused  Diagnostic Tests:  n/a   Impression:  Patient is doing well 4 months status post coronary artery bypass grafting. He is on appropriate medical therapy and clinically doing well.  Unfortunately he continues to smoke cigarettes.  Plan:  I've encouraged patient to continue to increase his physical activity without any particular limitations.  I've make every effort to stress the importance that this patient on a way to quit smoking. Risk factor modification has been discussed at length. All of his questions been addressed. The patient will return in 8 months for routine followup approximately one year after his original surgery.  I spent in excess of 15 minutes during the conduct of this office consultation and >50% of this time involved direct face-to-face encounter with the patient for counseling and/or coordination of their care.   Valentina Gu. Roxy Manns, MD 08/13/2014 2:02 PM

## 2014-08-13 NOTE — Patient Instructions (Signed)
The patient should make every effort to stop smoking immediately and permanently.  

## 2014-08-17 NOTE — Addendum Note (Signed)
Encounter addended by: Cathie Olden, RN on: 08/17/2014  3:15 PM<BR>     Documentation filed: Notes Section

## 2014-08-17 NOTE — Progress Notes (Signed)
Cardiac Rehabilitation Program Outcomes Report   Orientation:  05/15/14 Graduate Date:  tbd Discharge Date:  tbd # of sessions completed:3  Cardiologist: Burt Knack Family MD:  No PCP Class Time:  0815  A.  Exercise Program:  Tolerates exercise @ 3.81 METS for 15 minutes  B.  Mental Health:  Good mental attitude  C.  Education/Instruction/Skills  Accurately checks own pulse.  Rest:  72  Exercise:  105  Uses Perceived Exertion Scale and/or Dyspnea Scale  D.  Nutrition/Weight Control/Body Composition:  Adherence to prescribed nutrition program: good    E.  Blood Lipids    Lab Results  Component Value Date   CHOL 133 05/08/2014   HDL 26.50* 05/08/2014   LDLCALC 64 05/08/2014   TRIG 213.0* 05/08/2014   CHOLHDL 5 05/08/2014    F.  Lifestyle Changes:  Making positive lifestyle changes and Continues to smoke  G.  Symptoms noted with exercise:  Asymptomatic  Report Completed By:  Benay Pillow RN    Comments:  First week progress note.

## 2014-08-20 ENCOUNTER — Encounter: Payer: Self-pay | Admitting: Cardiovascular Disease

## 2014-08-20 ENCOUNTER — Ambulatory Visit (INDEPENDENT_AMBULATORY_CARE_PROVIDER_SITE_OTHER): Payer: BC Managed Care – PPO | Admitting: Cardiovascular Disease

## 2014-08-20 VITALS — BP 138/72 | HR 72 | Ht 72.0 in | Wt 338.8 lb

## 2014-08-20 DIAGNOSIS — E785 Hyperlipidemia, unspecified: Secondary | ICD-10-CM

## 2014-08-20 DIAGNOSIS — Z951 Presence of aortocoronary bypass graft: Secondary | ICD-10-CM

## 2014-08-20 MED ORDER — FUROSEMIDE 20 MG PO TABS: 20.0000 mg | ORAL_TABLET | Freq: Every day | ORAL | Status: DC | PRN

## 2014-08-20 MED ORDER — SILDENAFIL CITRATE 50 MG PO TABS: 50.0000 mg | ORAL_TABLET | Freq: Every day | ORAL | Status: DC | PRN

## 2014-08-20 MED ORDER — SILDENAFIL CITRATE 20 MG PO TABS: ORAL_TABLET | ORAL | Status: DC

## 2014-08-20 NOTE — Progress Notes (Signed)
Background: The patient is followed for coronary artery disease. He presented with non-ST elevation infarction in June 2015. He was found to have very severe three-vessel coronary artery disease and was treated with multivessel CABG (LIMA to LAD, saphenous vein graft to PDA, saphenous vein graft OM). LVEF has been normal. He had an uneventful recovery from bypass surgery. Other comorbidities include hypertension, hyperlipidemia, long-standing tobacco abuse, and morbid obesity.  HPI:  48 year old gentleman presenting for follow-up evaluation. He's doing fairly well today. His functional status is much better since bypass surgery. He's been able to do yard work without exertional chest pain or pressure. He denies shortness of breath. He complains of leg swelling, especially on the right. Symptoms are worse when he's been standing on his feet all day. He also complains of erectile dysfunction and relates this to his antihypertensive medicines. He is smoking 1 pack of cigarettes daily, down from 3 packs per day previously. His only other complaint is constipation.  Outpatient Encounter Prescriptions as of 08/20/2014  Medication Sig  . aspirin EC 325 MG EC tablet Take 1 tablet (325 mg total) by mouth daily.  Marland Kitchen atorvastatin (LIPITOR) 80 MG tablet Take 1 tablet (80 mg total) by mouth daily at 6 PM.  . Cyanocobalamin (B-12 PO) Take 1 tablet by mouth daily.  Marland Kitchen lisinopril (PRINIVIL,ZESTRIL) 5 MG tablet TAKE 1 TABLET BY MOUTH ONCE DAILY  . metoprolol (LOPRESSOR) 50 MG tablet TAKE (1) TABLET TWICE A DAY.    Allergies  Allergen Reactions  . Bee Venom Anaphylaxis  . Cauliflower [Brassica Oleracea Italica] Anaphylaxis  . Cortisone Other (See Comments)    Chest pain    Past Medical History  Diagnosis Date  . Hypertension   . High cholesterol   . Morbid obesity   . Tobacco abuse   . NSTEMI (non-ST elevated myocardial infarction) 04/03/2014  . Coronary artery disease 04/03/2014  . Arthritis   . Melanoma    . 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   ROS: Negative except as per HPI  BP 138/72  Pulse 72  Ht 6' (1.829 m)  Wt 338 lb 12.8 oz (153.679 kg)  BMI 45.94 kg/m2  PHYSICAL EXAM: Pt is alert and oriented, obese male in NAD HEENT: normal Neck: JVP - normal, carotids 2+= without bruits Lungs: CTA bilaterally CV: RRR without murmur or gallop Abd: soft, NT, Positive BS, no hepatomegaly Ext: 1+ edema on the right, trace on the left, distal pulses intact and equal Skin: warm/dry no rash  ASSESSMENT AND PLAN: 1. Coronary artery disease, native vessel. Stable without symptoms of angina. I would like him to continue on aspirin, atorvastatin, lisinopril, and metoprolol.  2. Essential hypertension. Blood pressure controlled on current medical therapy.  3. Hyperlipidemia. Labs from July 2015 reviewed with a total cholesterol 133, triglycerides 213, HDL 27, and LDL 64. Repeat lipids and LFTs in 3-4 months.  4. Tobacco abuse. Tobacco cessation counseling done. Pain is down to 1 pack per day, but I don't think he will be able to quit completely.  5. Morbid obesity. Lengthy discussion about need for weight loss through diet and exercise.  6. Erectile dysfunction. Prescription written for Viagra. Discussed the importance of his antihypertensive medications, but understand that this is likely a side effect of these drugs. He understands the contraindication with sublingual nitroglycerin.  7. Edema. Discussed  leg elevation, sodium restriction, and wrote prescription for furosemide 20 mg as needed.  Sherren Mocha, MD 08/20/2014 10:18 AM

## 2014-08-20 NOTE — Patient Instructions (Addendum)
Your physician has recommended you make the following change in your medication:  1. START Viagra 50mg  take as needed for erectile dysfunction 2. START Furosemide (Lasix) 20mg  take one by mouth as needed for swelling  Your physician recommends that you return for a FASTING LIPID, LIVER and BMP in 3-4 MONTHS--nothing to eat or drink after midnight, lab opens at 7:30 AM  Your physician recommends that you schedule a follow-up appointment in: 3-4 MONTHS with PA/NP  The pt called back to the office in regards to Viagra.  The pt's copayment for Viagra is over $400.  The pharmacist made the pt aware that if we prescribe Sildenafil 20mg  and have the pt take 2 1/2 tables then this will equal his current prescription and cost $2 per pill. I will authorize a new Rx.

## 2014-10-04 ENCOUNTER — Encounter (HOSPITAL_COMMUNITY): Payer: Self-pay | Admitting: Cardiovascular Disease

## 2014-10-09 NOTE — Progress Notes (Signed)
Patient is discharged from [facility] today July 09, 2014 with 18 sessions.  He achieved LTG of 30 minutes of aerobic exercise at max met level of 3.81.  All patient vitals are WNL.  Patient has met with dietician.  Discharge instructions have been reviewed in detail and patient expressed an understanding of material given. Cardiac Rehab will make 1 month, 6 month and 1 year call backs.  Patient had no complaints of any abnormal S/S or pain on their exit visit.

## 2014-10-09 NOTE — Addendum Note (Signed)
Encounter addended by: Cathie Olden, RN on: 10/09/2014  2:55 PM<BR>     Documentation filed: Notes Section

## 2014-10-09 NOTE — Progress Notes (Signed)
Cardiac Rehabilitation Program Outcomes Report   Orientation:  05/15/14 Graduate Date:  07/09/14 Discharge Date:  07/09/14 # of sessions completed: 18-patient had to return to work  Cardiologist: Burt Knack Family MD:  No PCP Class Time:  0815  A.  Exercise Program:  Tolerates exercise @ 3.81 METS for 15 minutes and Walk Test Results:  Post: 1 Post walk test 3.32 mets  B.  Mental Health:  Good mental attitude  C.  Education/Instruction/Skills  Accurately checks own pulse.  Rest:  64  Exercise:  91 and Uses Perceived Exertion Scale and/or Dyspnea Scale  Attended 6 education classes  D.  Nutrition/Weight Control/Body Composition:  Adherence to prescribed nutrition program: fair    E.  Blood Lipids    Lab Results  Component Value Date   CHOL 133 05/08/2014   HDL 26.50* 05/08/2014   LDLCALC 64 05/08/2014   TRIG 213.0* 05/08/2014   CHOLHDL 5 05/08/2014    F.  Lifestyle Changes:  Continues to smoke  G.  Symptoms noted with exercise:  Asymptomatic  Report Completed By: Stevphen Rochester RN   Comments:  Pt finished 18 sessions of CR and completed d/c papers. Patient had to return to work.

## 2014-10-11 ENCOUNTER — Other Ambulatory Visit: Payer: Self-pay | Admitting: Cardiovascular Disease

## 2014-11-21 IMAGING — CR DG CHEST 2V
2 series · 2 of 2 positions shown · non-contrast
Comparison: 04/06/2014

CLINICAL DATA: Atelectasis.

EXAM:
CHEST  2 VIEW

[w chest pa *]
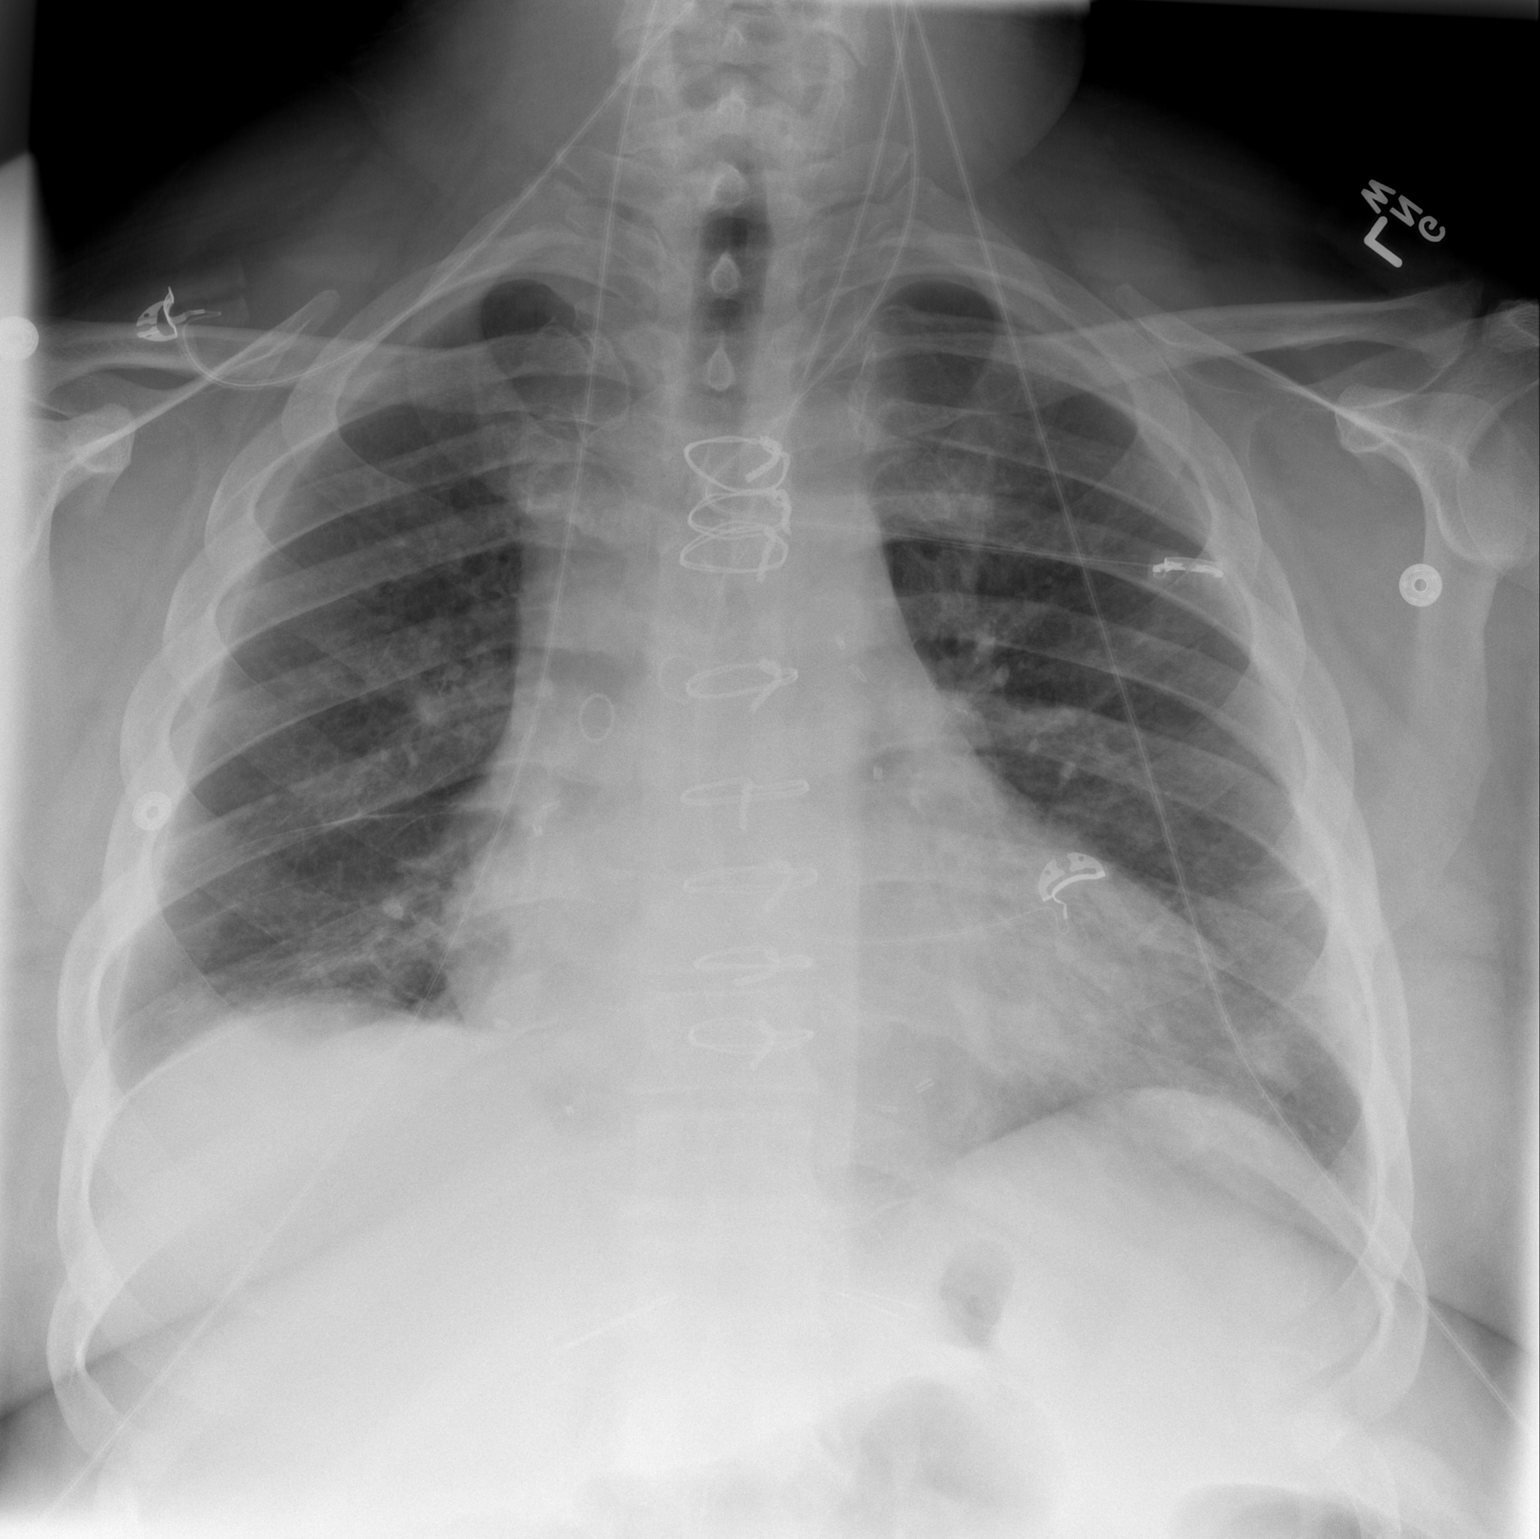

[w chest lat *]
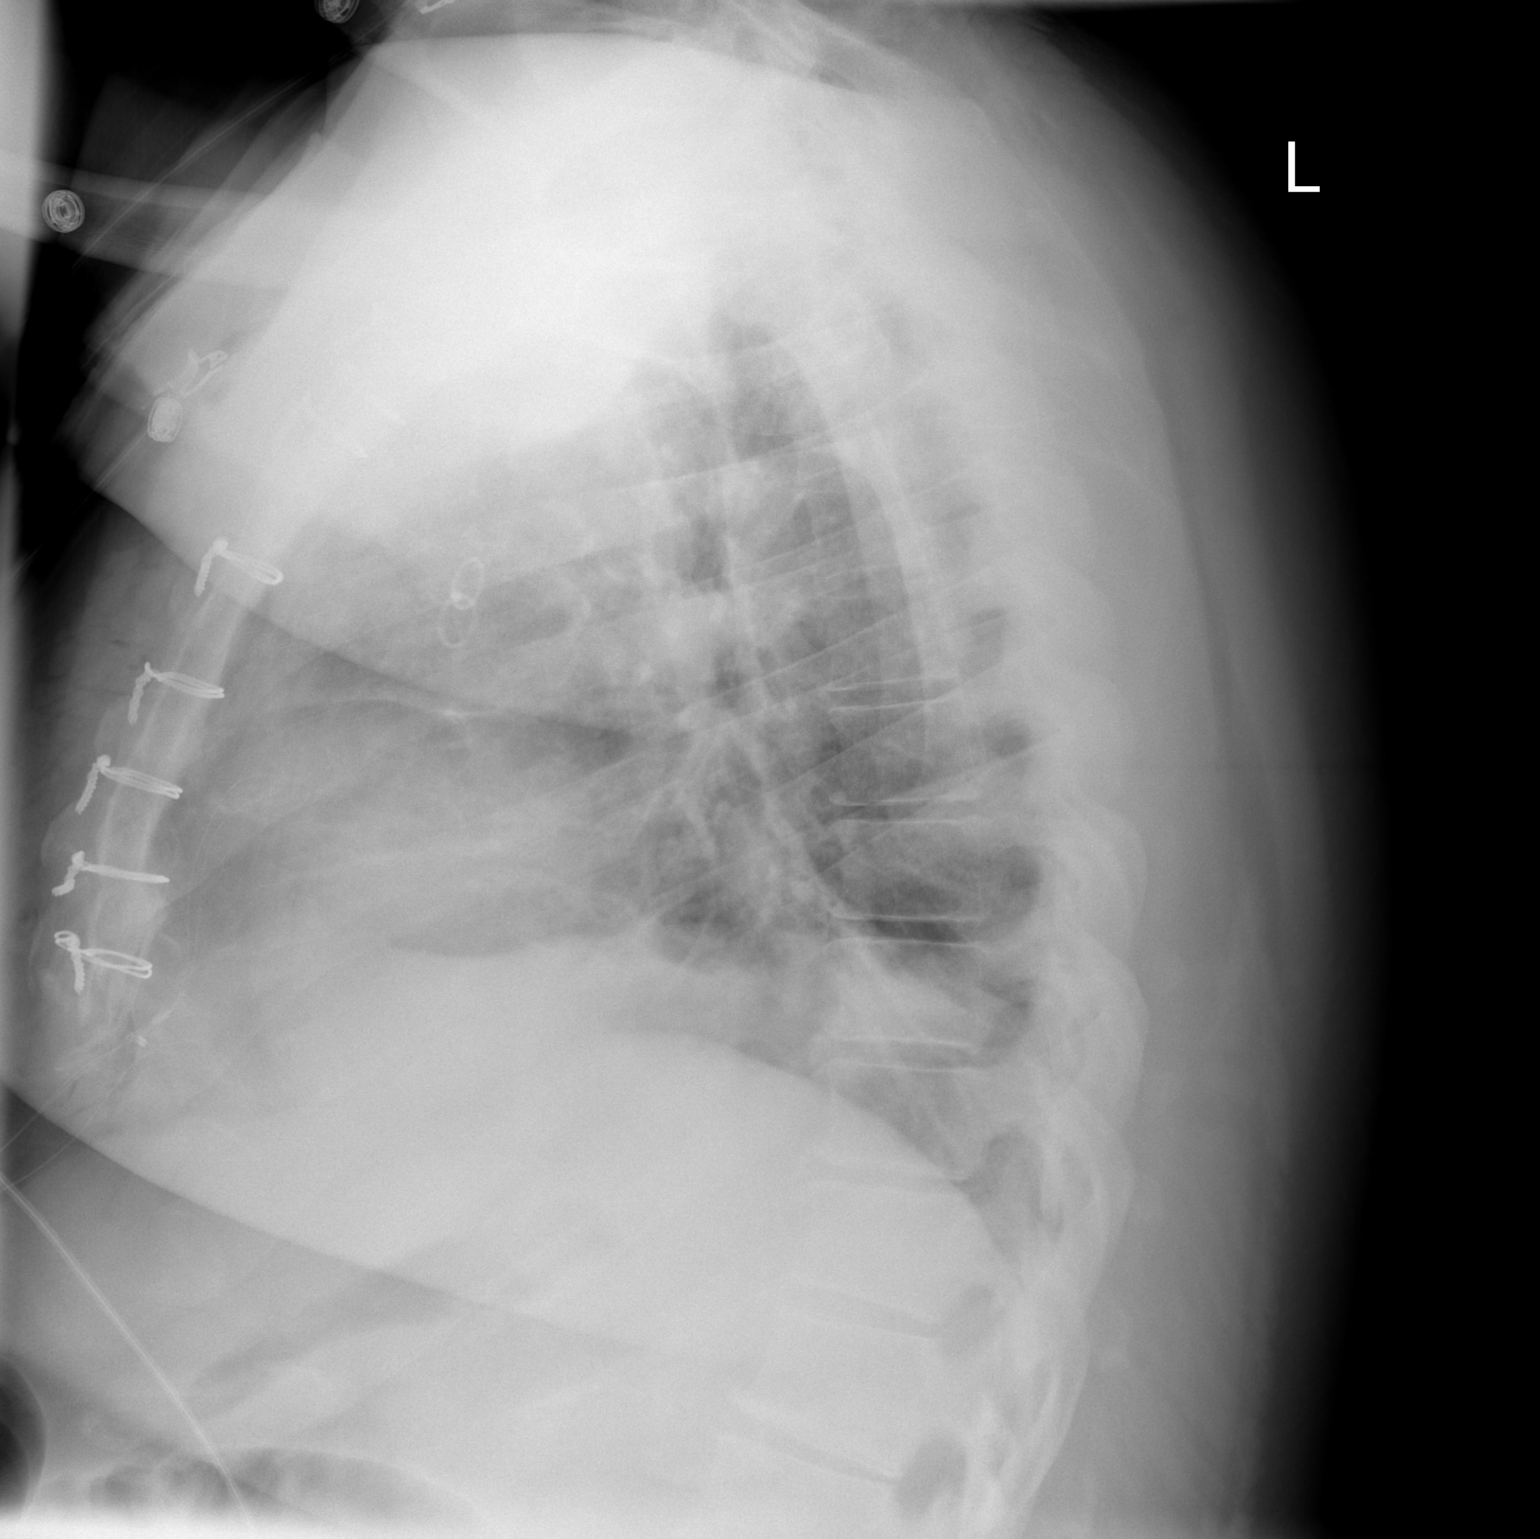

[2 of 2 positions shown; findings below may reference images not displayed]

FINDINGS: Status post median sternotomy and CABG procedure. The heart size is
mildly enlarged. No pleural effusion identified. Interval removal of
bilateral chest tubes. There is mild atelectasis in the left base.
IMPRESSION: Interval removal of bilateral chest tubes.

## 2014-12-06 ENCOUNTER — Encounter: Payer: Self-pay | Admitting: Nurse Practitioner

## 2014-12-10 ENCOUNTER — Other Ambulatory Visit: Payer: BC Managed Care – PPO

## 2014-12-10 ENCOUNTER — Ambulatory Visit: Payer: BC Managed Care – PPO | Admitting: Nurse Practitioner

## 2014-12-21 IMAGING — CR DG CHEST 2V
2 series · 2 of 2 positions shown · non-contrast
Comparison: 04/07/2014

CLINICAL DATA: Coronary artery disease post MI and CABG, history
smoking, hypertension, melanoma, hypercholesterolemia,

EXAM:
CHEST  2 VIEW

[w chest pa]
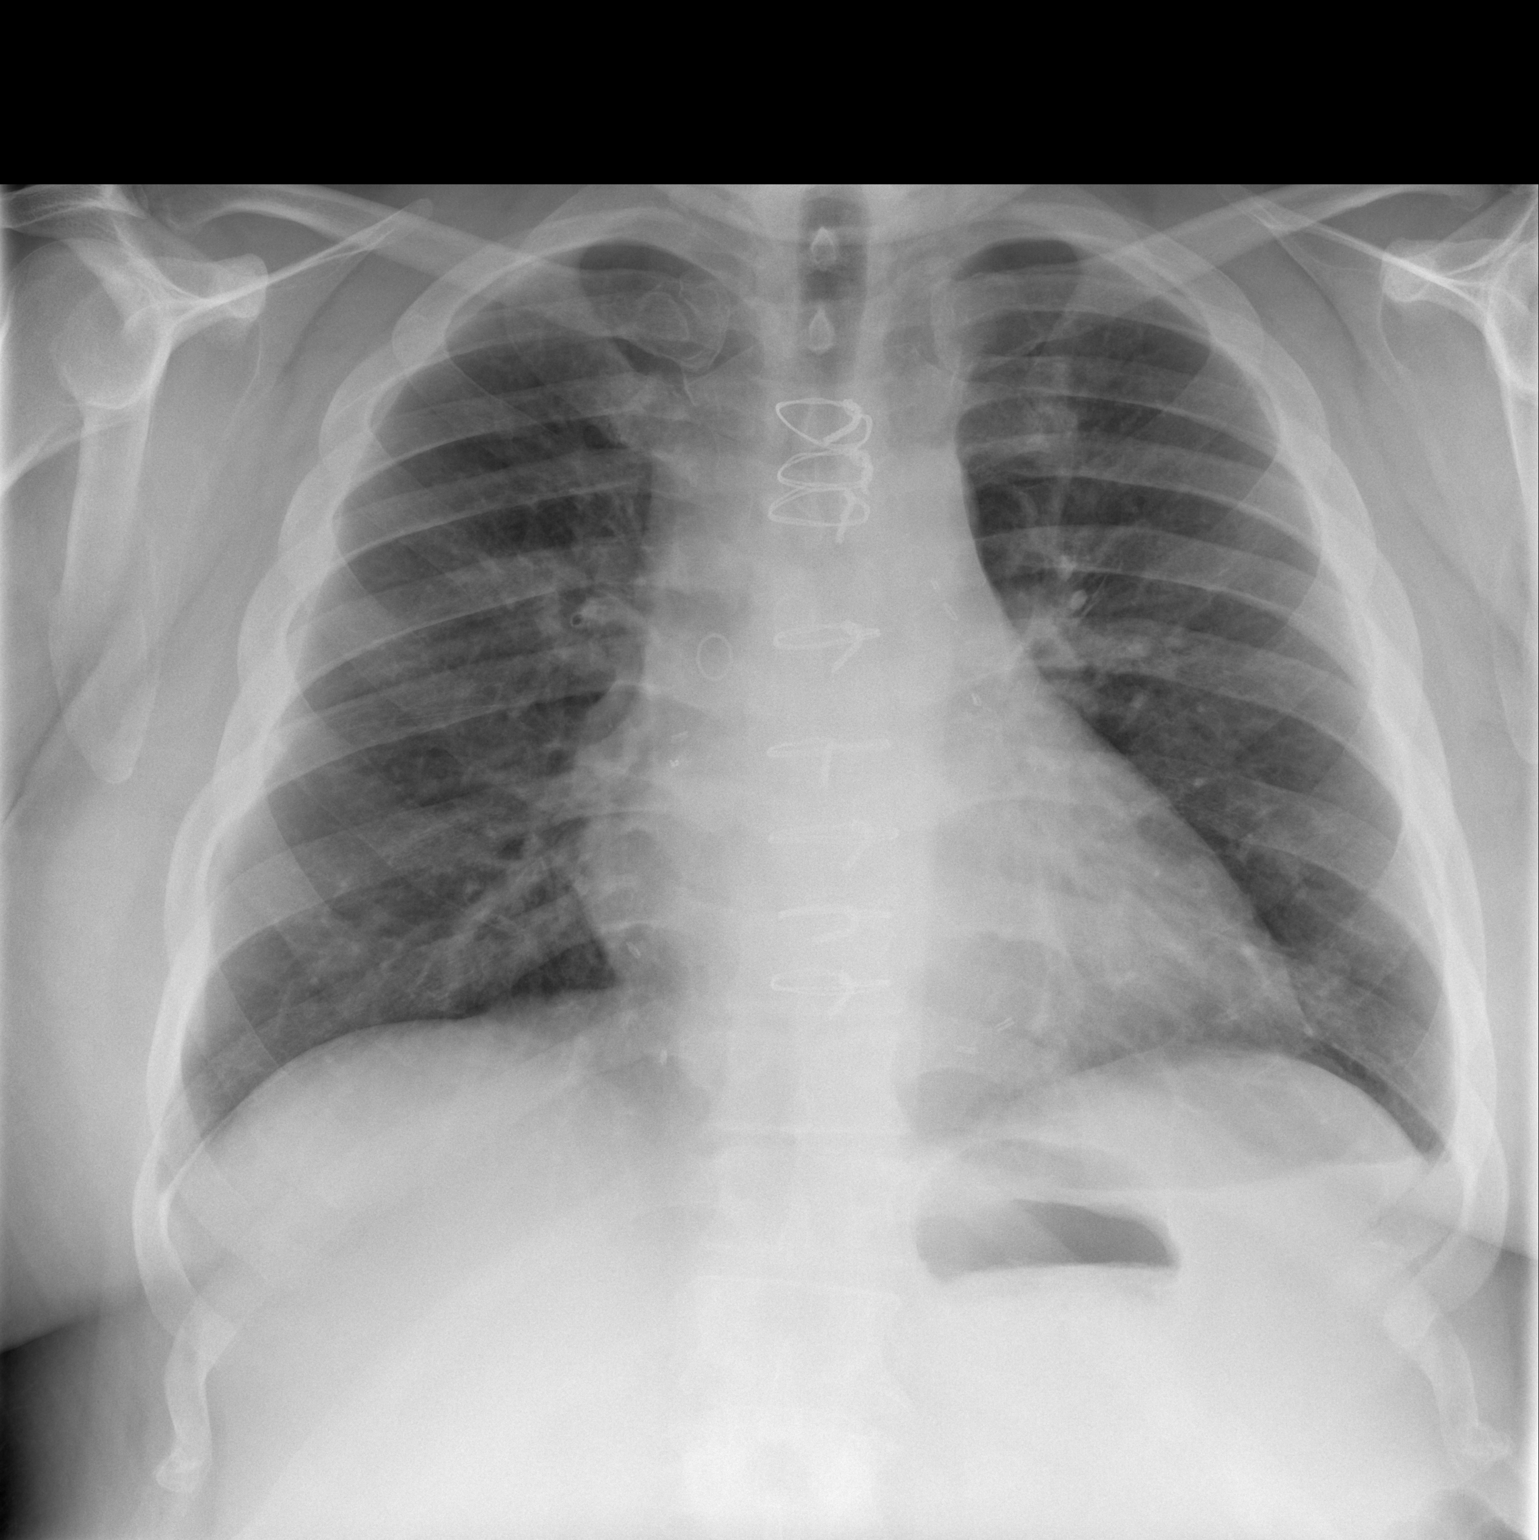

[w chest lat]
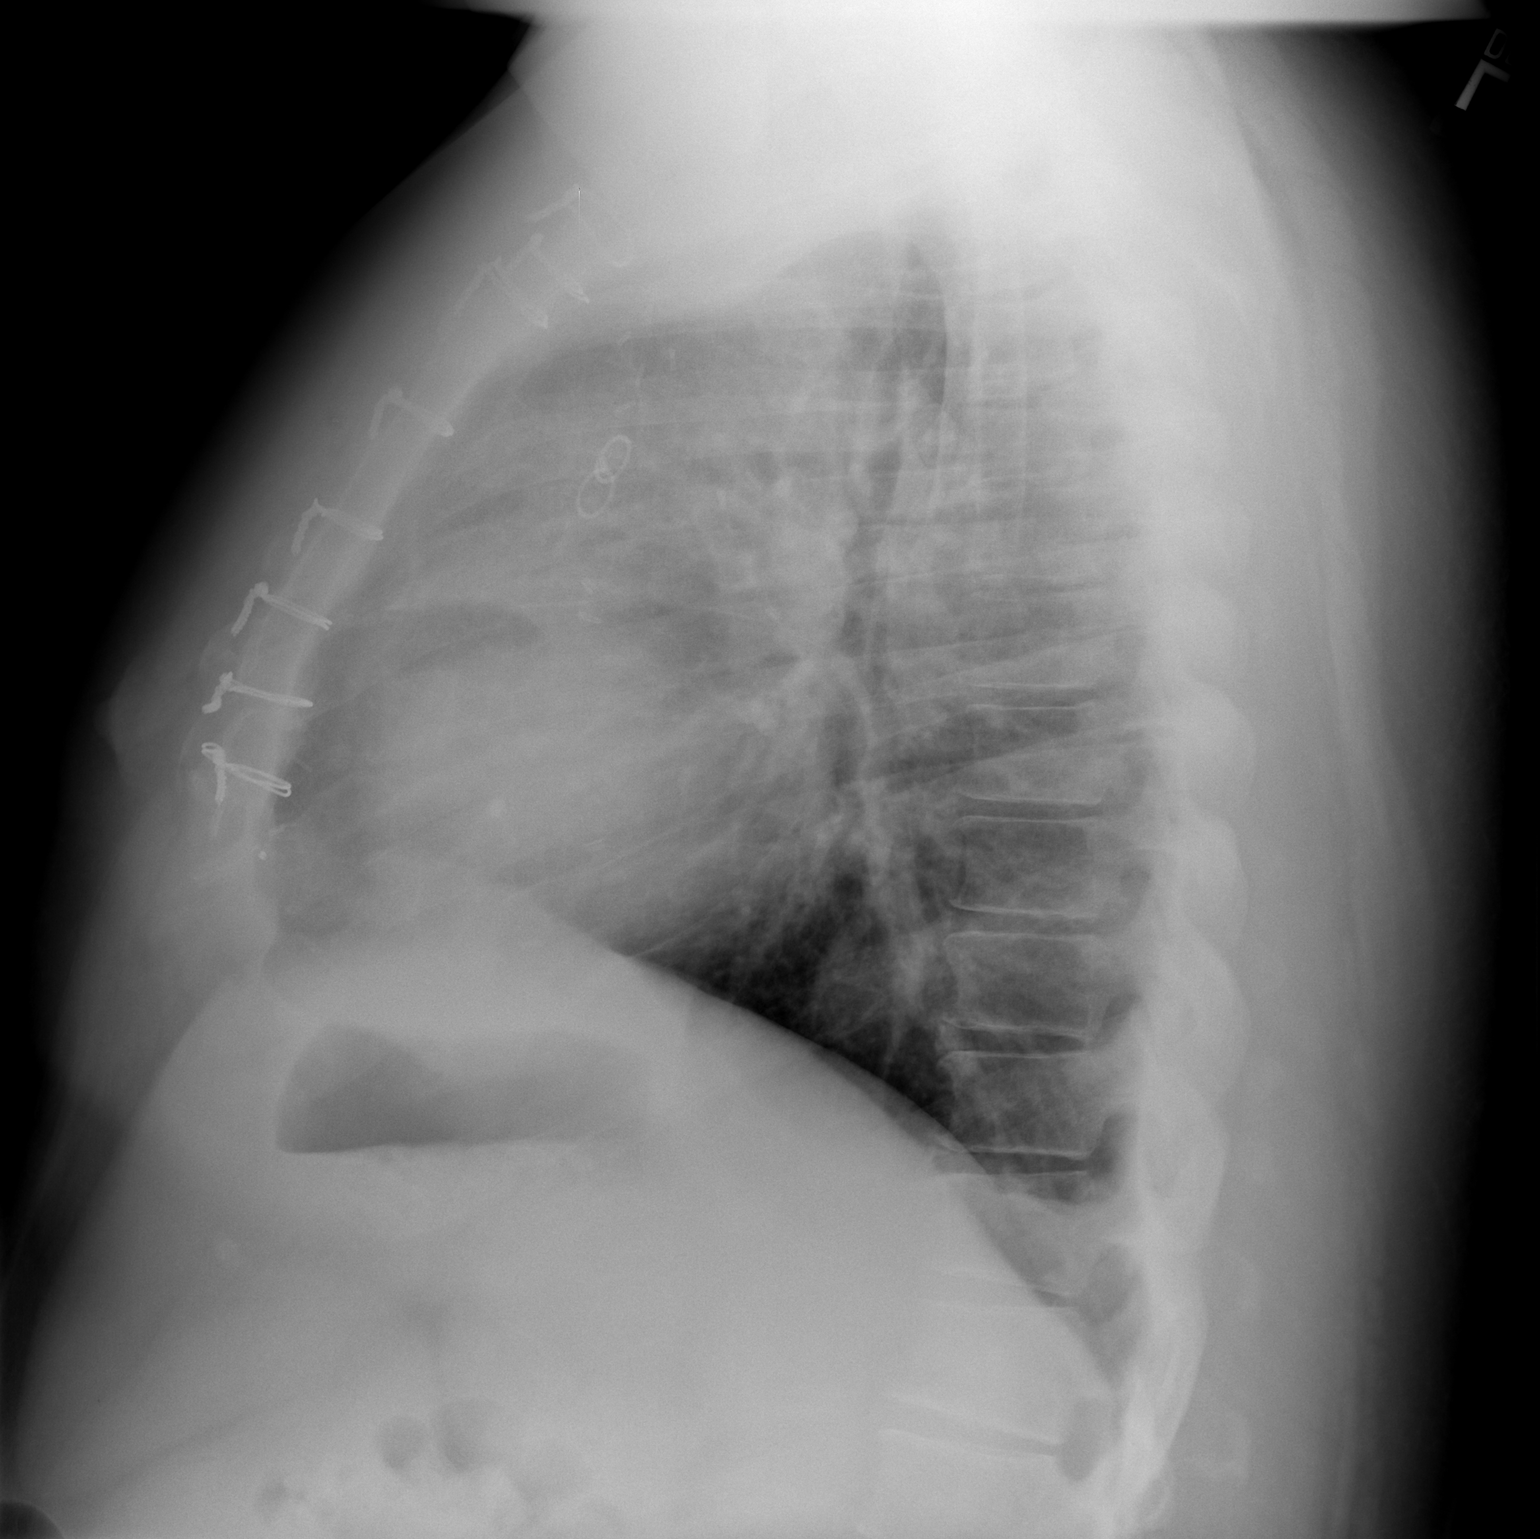

[2 of 2 positions shown; findings below may reference images not displayed]

FINDINGS: Minimal enlargement of cardiac silhouette post CABG.

Mediastinal contours and pulmonary vascularity normal.

Mild bronchitic changes without infiltrate, pleural effusion or
pneumothorax.

Bones unremarkable.
IMPRESSION: Enlargement of cardiac silhouette post CABG.

Bronchitic changes.

## 2014-12-24 ENCOUNTER — Other Ambulatory Visit (INDEPENDENT_AMBULATORY_CARE_PROVIDER_SITE_OTHER): Payer: BC Managed Care – PPO | Admitting: *Deleted

## 2014-12-24 ENCOUNTER — Encounter: Payer: Self-pay | Admitting: Nurse Practitioner

## 2014-12-24 ENCOUNTER — Ambulatory Visit (INDEPENDENT_AMBULATORY_CARE_PROVIDER_SITE_OTHER): Payer: BC Managed Care – PPO | Admitting: Nurse Practitioner

## 2014-12-24 VITALS — BP 142/88 | HR 76 | Ht 72.0 in | Wt 344.8 lb

## 2014-12-24 DIAGNOSIS — I2510 Atherosclerotic heart disease of native coronary artery without angina pectoris: Secondary | ICD-10-CM

## 2014-12-24 DIAGNOSIS — I10 Essential (primary) hypertension: Secondary | ICD-10-CM

## 2014-12-24 DIAGNOSIS — Z951 Presence of aortocoronary bypass graft: Secondary | ICD-10-CM

## 2014-12-24 DIAGNOSIS — E785 Hyperlipidemia, unspecified: Secondary | ICD-10-CM

## 2014-12-24 DIAGNOSIS — M791 Myalgia, unspecified site: Secondary | ICD-10-CM

## 2014-12-24 LAB — LIPID PANEL
CHOLESTEROL: 155 mg/dL (ref 0–200)
HDL: 24.5 mg/dL — AB (ref >39.00)
NonHDL: 130.5
TRIGLYCERIDES: 201 mg/dL — AB (ref 0.0–149.0)
Total CHOL/HDL Ratio: 6
VLDL: 40.2 mg/dL — ABNORMAL HIGH (ref 0.0–40.0)

## 2014-12-24 LAB — BASIC METABOLIC PANEL
BUN: 14 mg/dL (ref 6–23)
CALCIUM: 9.3 mg/dL (ref 8.4–10.5)
CHLORIDE: 103 meq/L (ref 96–112)
CO2: 31 meq/L (ref 19–32)
Creatinine, Ser: 0.93 mg/dL (ref 0.40–1.50)
GFR: 92.68 mL/min (ref >60.00)
Glucose, Bld: 78 mg/dL (ref 70–99)
POTASSIUM: 3.9 meq/L (ref 3.5–5.1)
SODIUM: 138 meq/L (ref 135–145)

## 2014-12-24 LAB — HEPATIC FUNCTION PANEL
ALBUMIN: 3.9 g/dL (ref 3.5–5.2)
ALT: 22 U/L (ref 0–53)
AST: 16 U/L (ref 0–37)
Alkaline Phosphatase: 109 U/L (ref 39–117)
Bilirubin, Direct: 0.1 mg/dL (ref 0.0–0.3)
Total Bilirubin: 0.4 mg/dL (ref 0.2–1.2)
Total Protein: 7.1 g/dL (ref 6.0–8.3)

## 2014-12-24 LAB — LDL CHOLESTEROL, DIRECT: Direct LDL: 109 mg/dL

## 2014-12-24 NOTE — Patient Instructions (Addendum)
We will be checking the following labs today BMET, CBC, HPF, Lipids, TSH, sed rate and CK total  Stay on your current medicines but I am stopping your Lipitor for the next 2 weeks - call us and let us know if this helped or not  See Dr. Burt Knack in 6 months  Call the [place] office at [phone] if you have any questions, problems or concerns.

## 2014-12-24 NOTE — Progress Notes (Signed)
CARDIOLOGY OFFICE NOTE  Date:  12/24/2014    Fernando Schmidt Date of Birth: [birth year] Medical Record #[id number]  PCP:  [facility]elta Addison, Claretha Cooper, DO  Cardiologist:  Burt Knack    Chief Complaint  Patient presents with  . Coronary Artery Disease    6 month check - seen for Dr. Burt Knack     History of Present Illness: Fernando Schmidt is a 49 y.o. male who presents today for a 6 month check. Seen for Dr. Burt Knack.   He has a history of known coronary artery disease. He presented with non-ST elevation infarction in June 2015. He was found to have very severe three-vessel coronary artery disease and was treated with multivessel CABG (LIMA to LAD, saphenous vein graft to PDA, saphenous vein graft OM) per Dr. Roxy Manns. LVEF has been normal. He had an uneventful recovery from bypass surgery. Other comorbidities include hypertension, hyperlipidemia, long-standing tobacco abuse, and morbid obesity.  He was last seen here in October of 2015 by Dr. Burt Knack. Continued to smoke. Having some issues with leg swelling.   Comes in today. Here alone. No active chest pain/pressure. Says he is not short of breath. Having more in the way of diffuse joint aching. Notes less strength. Having to use more aspirin. Continues to smoke. Not ready to quit. Weight continues to climb - drinking lots of soda. Working 2 jobs so "I will not go back to jail". 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 Not dizzy or lightheaded. Eats out for lunch. Still with swelling in his legs.   Past Medical History  Diagnosis Date  . Hypertension   . High cholesterol   . Morbid obesity   . Tobacco abuse   . NSTEMI (non-ST elevated myocardial infarction) 04/03/2014  . Coronary artery disease 04/03/2014  . Arthritis   . Melanoma   . 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 Back surgery    . Hip surgery Left     melanoma excision  . Total ankle replacement Left   . Shoulder surgery Right   . Knee arthroscopy Left   . Coronary artery bypass graft N/A 04/04/2014    Procedure: Coronary artery bypass graft times three using left internal mammary artery and right leg greater saphenous vein.;  Surgeon: Rexene Alberts, MD;  Location: [facility];  Service: Open Heart Surgery;  Laterality: N/A;  . Intraoperative transesophageal echocardiogram N/A 04/04/2014    Procedure: INTRAOPERATIVE TRANSESOPHAGEAL ECHOCARDIOGRAM;  Surgeon: Rexene Alberts, MD;  Location: [facility];  Service: Open Heart Surgery;  Laterality: N/A;  . Left heart catheterization with coronary angiogram N/A 04/03/2014    Procedure: LEFT HEART CATHETERIZATION WITH CORONARY ANGIOGRAM;  Surgeon: Blane Ohara, MD;  Location: [facility] CATH LAB;  Service: Cardiovascular;  Laterality: N/A;     Medications: Current Outpatient Prescriptions  Medication Sig Dispense Refill  . aspirin EC 325 MG EC tablet Take 1 tablet (325 mg total) by mouth daily. 30 tablet 0  . atorvastatin (LIPITOR) 80 MG tablet TAKE 1 TABLET ONCE DAILY AT 6 PM. 30 tablet 3  . furosemide (LASIX) 20 MG tablet Take 1 tablet (20 mg total) by mouth daily as needed. 90 tablet 3  . lisinopril (PRINIVIL,ZESTRIL)  5 MG tablet TAKE 1 TABLET BY MOUTH ONCE DAILY 30 tablet 5  . metoprolol (LOPRESSOR) 50 MG tablet TAKE (1) TABLET TWICE A DAY. 60 tablet 5  . sildenafil (REVATIO) 20 MG tablet Take two and a half tablets by mouth daily as needed for ED 25 tablet 2   No current facility-administered medications for this visit.    Allergies: Allergies  Allergen Reactions  . Bee Venom Anaphylaxis  . Cauliflower [Brassica Oleracea Italica] Anaphylaxis  . Cortisone Other (See Comments)    Chest pain    Social History: The patient  reports that he has been smoking Cigarettes.  He has a 15 pack-year smoking history. He has never used smokeless  tobacco. He reports that he drinks alcohol. He reports that he does not use illicit drugs.   Family History: The patient's family history includes Diabetes in his mother; Heart failure in his father.   Review of Systems: Please see the history of present illness.   Otherwise, the review of systems is positive for weight gain, leg swelling, visual disturbance, depression, back pain, muscle pain, sweating, leg pain, constipation, joint swelling and headaches.   All other systems are reviewed and negative.   Physical Exam: VS:  BP 142/88 mmHg  Pulse 76  Ht 6' (1.829 m)  Wt 344 lb 12.8 oz (156.4 kg)  BMI 46.75 kg/m2  SpO2 97% .  BMI Body mass index is 46.75 kg/(m^2).  Wt Readings from Last 3 Encounters:  12/24/14 344 lb 12.8 oz (156.4 kg)  08/20/14 338 lb 12.8 oz (153.679 kg)  08/13/14 317 lb (143.79 kg)    General: Pleasant. He is obese. He looks older than his stated age. He is in no acute distress.His weight continues to climb. HEENT: Normal but with poor dentition. Neck: Supple, no JVD, carotid bruits, or masses noted.  Cardiac: Regular rate and rhythm. No murmurs, rubs, or gallops. +1 edema bilaterally.  Respiratory:  Lungs are clear - few scattered wheezes - with normal work of breathing.  GI: Soft and nontender. Abdomen is obese.  MS: No deformity or atrophy. Gait and ROM intact. Skin: Warm and dry. Color is normal.  Neuro:  Strength and sensation are intact and no gross focal deficits noted.  Psych: Alert, appropriate and with normal affect.   LABORATORY DATA:  EKG:  EKG is not ordered today.   Lab Results  Component Value Date   WBC 6.8 05/08/2014   HGB 13.6 05/08/2014   HCT 41.4 05/08/2014   PLT 258.0 05/08/2014   GLUCOSE 82 05/08/2014   CHOL 133 05/08/2014   TRIG 213.0* 05/08/2014   HDL 26.50* 05/08/2014   LDLCALC 64 05/08/2014   ALT 20 05/08/2014   AST 19 05/08/2014   NA 138 05/08/2014   K 4.2 05/08/2014   CL 103 05/08/2014   CREATININE 0.9 05/08/2014    BUN 9 05/08/2014   CO2 28 05/08/2014   TSH 1.270 04/03/2014   INR 1.32 04/04/2014   HGBA1C 5.1 04/03/2014    BNP (last 3 results) No results for input(s): BNP in the last 8760 hours.  ProBNP (last 3 results)  Recent Labs  04/03/14 1410  PROBNP 38.9     Other Studies Reviewed Today: N/A  Assessment/Plan: 1. Coronary artery disease, native vessel - no active symptoms but with lots of risk factors that are not being modified. Would keep on current regimen. Tried to be encouraging about making his health a priority - at this time - he is not able  to comply - he does understand the consequences.   2. Essential hypertension - BP fair.   3. Hyperlipidemia -  On high dose statin - will give him a 2 week holiday - check sed rate and CK total today. He is to call us in 2 weeks for further direction.   4. Tobacco abuse - he is not ready to stop despite counseling.  5. Morbid obesity   6. Erectile dysfunction  7. Edema -  Chronic - getting too much salt.   Overall prognosis tenuous at best.   Current medicines are reviewed with the patient today.  The patient does not have concerns regarding medicines other than what has been noted above.  The following changes have been made:  See above.  Labs/ tests ordered today include:    Orders Placed This Encounter  Procedures  . Basic metabolic panel  . CBC  . Hepatic function panel  . Lipid panel  . TSH  . CK Total (and CKMB)  . Sed Rate (ESR)     Disposition:   FU with Dr. Burt Knack in 6 months  Patient is agreeable to this plan and will call if any problems develop in the interim.   Signed: Burtis Junes, RN, ANP-C 12/24/2014 8:52 AM  [facility] [address] [place], [place]  [address] Phone: [phone] Fax: [phone]

## 2015-01-09 ENCOUNTER — Encounter: Payer: Self-pay | Admitting: Cardiovascular Disease

## 2015-01-09 NOTE — Telephone Encounter (Signed)
This encounter was created in error - please disregard.

## 2015-01-09 NOTE — Telephone Encounter (Signed)
New message    Patient returning call back to nurse from yesterday.

## 2015-02-08 ENCOUNTER — Other Ambulatory Visit: Payer: Self-pay | Admitting: Cardiovascular Disease

## 2015-02-17 NOTE — Discharge Summary (Signed)
PATIENT NAME:  [name], [name] MR#:  [id number] DATE OF BIRTH:  Jul 20, 1966  DATE OF ADMISSION:  03/04/2012 DATE OF DISCHARGE:  03/06/2012  BRIEF HISTORY: Edris Friedt is a 49 year old gentleman seen in the Emergency Room with a large right groin and mid thigh cellulitis with an indurated area suggestive of possible subcutaneous abscess. He has no antecedent event that precipitated this problem. He was admitted to the hospital, placed on IV antibiotics, and observed over the course of the evening. The following morning he had a temperature of 103 and the leg appeared to be more indurated. He was taken to surgery the morning of 03/05/2012 and underwent incision and drainage procedure. A small abscess was identified, multiple counter incisions were made. Drains were placed. Culture was taken which demonstrated some staphylococcal organisms. His temperature defervesced. The wound looks better with less erythema and less tenderness.   DISPOSITION: He was discharged home today to be followed in the office in 3 to 5 days' time for drain removal. He is to take Percocet for pain. We are not discharging him home on antibiotics at the present time.   FINAL DISCHARGE DIAGNOSIS: Right thigh abscess and cellulitis.   SURGERY: Incision and drainage abscess and cellulitis right thigh.   ____________________________ Micheline Maze, MD rle:rbg D: 03/06/2012 10:12:31 ET T: 03/08/2012 12:31:34 ET JOB#: [id number]  cc: Micheline Maze, MD, <Dictator> Eather Colas. Sharlett Iles, FNP-C Rodena Goldmann MD ELECTRONICALLY SIGNED 03/09/2012 16:40

## 2015-02-17 NOTE — Op Note (Signed)
PATIENT NAME:  DETAVIOUS, RINN MR#:  [id number] DATE OF BIRTH:  1966/06/20  DATE OF PROCEDURE:  03/05/2012  PREOPERATIVE DIAGNOSIS: Right thigh abscess.   POSTOPERATIVE DIAGNOSIS: Right thigh abscess.   PROCEDURE PERFORMED: Incision and drainage right thigh abscess.   SURGEON: Rodena Goldmann, MD   OPERATIVE PROCEDURE: With the patient in the supine position after the induction of appropriate general anesthesia, the patient's right leg was prepped with ChloraPrep and draped with sterile towels. The area was quite indurated and cellulitic. The incision was made in the upper mid thigh and a small amount of purulence was encountered. Counter incisions were made in three areas and a significant amount of purulence was then drained. The material was foul-smelling but there did not appear to be any evidence of any fascial necrosis or ischemia. The area was cultured. A Penrose drain was inserted through the counter incisions and sutured in place with 3-0 nylon. Sterile dressing was applied. The patient was returned to the recovery room having tolerated the procedure well. Sponge, instrument, and needle counts were correct times two in the operating room.   ____________________________ Micheline Maze, MD rle:bjt D: 03/05/2012 12:50:49 ET T: 03/06/2012 14:51:26 ET JOB#: [id number]  cc: Micheline Maze, MD, <Dictator> Eather Colas. Sharlett Iles, FNP-C Rodena Goldmann MD ELECTRONICALLY SIGNED 03/09/2012 16:40

## 2015-02-17 NOTE — H&P (Signed)
PATIENT NAME:  Fernando Schmidt, Fernando Schmidt MR#:  [id number] DATE OF BIRTH:  May 01, 1966  DATE OF ADMISSION:  03/04/2012  PRIMARY CARE PHYSICIAN: Erma Pinto, FNP-C   ADMITTING PHYSICIAN: Dr. Pat Patrick   CHIEF COMPLAINT: Groin pain.   BRIEF HISTORY: Abrham Maslowski is a 49 year old gentleman seen in the Emergency Room with a several day history of right groin pain and erythema. He has had no previous history. He presented to the Emergency Room for further evaluation. He does have significant cellulitis suggestive of possible abscess formation. White blood cell count was elevated at 15,000. The surgical service was consulted.   He has no previous similar symptoms. He denies any previous abscesses of any type. He has had multiple orthopedic procedures. He has history of hypertension currently not on antihypertensives. No cardiac disease or diabetes. No previous abdominal surgery. He takes no medications regularly. He does have a history of behavioral disorder and was initially treated with citalopram but is currently not on any psychiatric medications.   ALLERGIES: He is allergic to cortisone.   SOCIAL HISTORY: He is a longstanding cigarette smoker, smoking approximately a pack and a half of cigarettes a day. He does drink alcohol occasionally.   REVIEW OF SYSTEMS: Otherwise unremarkable.   PHYSICAL EXAMINATION:   GENERAL: He is an alert, pleasant gentleman in no significant distress. He does have some mild lower extremity pain.   VITAL SIGNS: Heart rate 114 and regular, blood pressure 185/100, temperature 102.   HEENT: Unremarkable.   LYMPH: He has no axillary or cervical adenopathy.   NECK: His trachea is midline.   CHEST: Clear with no adventitious sounds. Normal pulmonary excursion.   CARDIAC: No murmurs or gallops to my ear. He seems to be in normal sinus rhythm.   ABDOMEN: Benign with no organomegaly. No masses. No hernias noted. He has no rebound tenderness and no guarding.   EXTREMITIES:  Large erythematous area in the right groin with some skin tenderness and induration. I cannot palpate an abscess. There does not appear to be any obvious fluid collection at the present time. He has good distal pulses and full range of motion.    PSYCHIATRIC: Normal psychiatric exam with normal affect and normal orientation.   IMPRESSION: We will admit him to the hospital and place him on IV antibiotics and follow his course over the next several hours. I do not see a mature abscess at the present time that would require incision and drainage. This plan has been discussed with him in detail and he is in agreement.   ____________________________ Micheline Maze, MD rle:drc D: 03/04/2012 13:15:37 ET T: 03/04/2012 13:31:58 ET JOB#: [id number]  cc: Micheline Maze, MD, <Dictator> Eather Colas. Sharlett Iles, FNP-C Rodena Goldmann MD ELECTRONICALLY SIGNED 03/05/2012 8:42

## 2015-04-15 ENCOUNTER — Encounter: Payer: BC Managed Care – PPO | Admitting: Thoracic Surgery (Cardiothoracic Vascular Surgery)

## 2015-04-22 ENCOUNTER — Encounter: Payer: Self-pay | Admitting: Thoracic Surgery (Cardiothoracic Vascular Surgery)

## 2015-04-22 ENCOUNTER — Ambulatory Visit (INDEPENDENT_AMBULATORY_CARE_PROVIDER_SITE_OTHER): Payer: BC Managed Care – PPO | Admitting: Thoracic Surgery (Cardiothoracic Vascular Surgery)

## 2015-04-22 VITALS — BP 131/71 | HR 66 | Resp 18 | Ht 72.0 in | Wt 340.0 lb

## 2015-04-22 DIAGNOSIS — I2510 Atherosclerotic heart disease of native coronary artery without angina pectoris: Secondary | ICD-10-CM

## 2015-04-22 DIAGNOSIS — I2584 Coronary atherosclerosis due to calcified coronary lesion: Secondary | ICD-10-CM | POA: Diagnosis not present

## 2015-04-22 DIAGNOSIS — Z951 Presence of aortocoronary bypass graft: Secondary | ICD-10-CM | POA: Diagnosis not present

## 2015-04-22 NOTE — Progress Notes (Signed)
[address]       ,[place] [address]             [phone]     CARDIOTHORACIC SURGERY OFFICE NOTE  Referring Provider is Sherren Mocha, MD PCP is Velta Addison, Claretha Cooper, DO   HPI:  Patient returns for routine followup approximately 1 year status post coronary artery bypass grafting x3 on 04/04/2014 for severe three-vessel coronary artery disease, status post acute non-ST segment elevation myocardial infarction.  He was last seen here in our office on 08/13/2014. Since then he has been seen in follow-up by Dr. Burt Knack and by Truitt Merle at [facility].  Clinically the patient has done very well. 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 His job for the [employer] is physically strenuous and frequently requires heavy lifting. He reports no significant physical limitations. He states that he'll get short of breath or tightness in his chest with very strenuous activity. He feels much better than he did prior to surgery.  Unfortunately, he continues to smoke cigarettes and he has not lost much weight.  He claims to be eating a more healthy diet and he tries to minimize salt intake. He continues to have problems with chronic lower extremity edema. He also complains of some arthralgias related to Lipitor use.   Current Outpatient Prescriptions  Medication Sig Dispense Refill  . aspirin EC 325 MG EC tablet Take 1 tablet (325 mg total) by mouth daily. 30 tablet 0  . atorvastatin (LIPITOR) 80 MG tablet TAKE 1 TABLET DAILY AT 6 PM 30 tablet 5  . furosemide (LASIX) 20 MG tablet Take 1 tablet (20 mg total) by mouth daily as needed. 90 tablet 3  . lisinopril (PRINIVIL,ZESTRIL) 5 MG tablet TAKE 1 TABLET ONCE DAILY 30 tablet 5  . metoprolol (LOPRESSOR) 50 MG tablet TAKE (1) TABLET TWICE A DAY. 60 tablet 5  . sildenafil (REVATIO) 20 MG tablet Take two and a half tablets by mouth daily as  needed for ED 25 tablet 2   No current facility-administered medications for this visit.      Physical Exam:   BP 131/71 mmHg  Pulse 66  Resp 18  Ht 6' (1.829 m)  Wt 340 lb (154.223 kg)  BMI 46.10 kg/m2  SpO2 98%  General:  Obese but well appearing  Chest:   Clear to auscultation  CV:   Regular rate and rhythm without murmur  Incisions:  Completely healed, sternum is stable  Abdomen:  Soft and nontender  Extremities:  Warm and well-perfused with severe bilateral lower extremity edema and signs of chronic venous stasis disease.  Diagnostic Tests:  n/a   Impression:  Patient is clinically doing well up proximally one year status post coronary artery bypass grafting for severe multivessel coronary artery disease, status post acute non-ST segment elevation myocardial infarction.  He has not had any late complications or other cardiac events over the past year.  Long-term prognosis will remain guarded at best because of the patient's chronic medical problems including morbid obesity, tobacco abuse, chronic venous insufficiency and hyperlipidemia.  He seems to understand the potential impact of his chronic medical problems but he clearly remains unwilling to take significant effort into changing his lifestyle  Plan:  The patient has been counseled at length regarding the potential benefits of smoking cessation, weight loss, and regular exercise. All  of his questions have been addressed. In the future he will call and return to see [place] as needed  I spent in excess of 10 minutes during the conduct of this office consultation and >50% of this time involved direct face-to-face encounter with the patient for counseling and/or coordination of their care.    Valentina Gu. Roxy Manns, MD 04/22/2015 1:14 PM

## 2015-04-22 NOTE — Patient Instructions (Signed)
The patient should make every effort to stop smoking immediately and permanently.  The patient is reminded of the numerous potential long-term benefits of regular exercise and adherence to a "heart healthy" diet

## 2015-05-30 ENCOUNTER — Telehealth: Payer: Self-pay | Admitting: *Deleted

## 2015-05-30 ENCOUNTER — Other Ambulatory Visit: Payer: Self-pay | Admitting: *Deleted

## 2015-05-30 DIAGNOSIS — Z951 Presence of aortocoronary bypass graft: Secondary | ICD-10-CM

## 2015-05-30 DIAGNOSIS — E785 Hyperlipidemia, unspecified: Secondary | ICD-10-CM

## 2015-05-30 MED ORDER — SILDENAFIL CITRATE 20 MG PO TABS: ORAL_TABLET | ORAL | Status: AC

## 2015-05-30 MED ORDER — FUROSEMIDE 20 MG PO TABS: 20.0000 mg | ORAL_TABLET | Freq: Every day | ORAL | Status: DC | PRN

## 2015-05-30 NOTE — Telephone Encounter (Signed)
Patient left a vm on the refill line requesting a new rx for sildenafil to be sent to [facility]. Please advise. Thanks, MI

## 2015-05-30 NOTE — Telephone Encounter (Signed)
Rx sent to the pharmacy.

## 2015-07-05 ENCOUNTER — Other Ambulatory Visit: Payer: Self-pay | Admitting: Cardiovascular Disease

## 2015-07-30 ENCOUNTER — Emergency Department (HOSPITAL_COMMUNITY)
Admission: EM | Admit: 2015-07-30 | Discharge: 2015-07-30 | Disposition: A | Discharge status: Home / Self Care | Payer: BC Managed Care – PPO | Source: Home / Self Care | Attending: Emergency Medicine | Admitting: Emergency Medicine

## 2015-07-30 ENCOUNTER — Encounter (HOSPITAL_COMMUNITY): Payer: Self-pay | Admitting: Emergency Medicine

## 2015-07-30 DIAGNOSIS — M79661 Pain in right lower leg: Secondary | ICD-10-CM

## 2015-07-30 DIAGNOSIS — M79604 Pain in right leg: Secondary | ICD-10-CM | POA: Diagnosis not present

## 2015-07-30 DIAGNOSIS — M7989 Other specified soft tissue disorders: Principal | ICD-10-CM

## 2015-07-30 DIAGNOSIS — L03115 Cellulitis of right lower limb: Secondary | ICD-10-CM | POA: Diagnosis not present

## 2015-07-30 MED ORDER — TRAMADOL HCL 50 MG PO TABS
50.0000 mg | ORAL_TABLET | Freq: Once | ORAL | Status: AC
Start: 2015-07-30 — End: 2015-07-30
  Administered 2015-07-30: 50 mg via ORAL
  Filled 2015-07-30: qty 1

## 2015-07-30 MED ORDER — SODIUM CHLORIDE 0.9 % IV BOLUS (SEPSIS)
1000.0000 mL | Freq: Once | INTRAVENOUS | Status: AC
  Administered 2015-07-30: 1000 mL via INTRAVENOUS

## 2015-07-30 MED ORDER — CEFAZOLIN SODIUM 1-5 GM-% IV SOLN
1.0000 g | Freq: Once | INTRAVENOUS | Status: AC
  Administered 2015-07-30: 1 g via INTRAVENOUS
  Filled 2015-07-30: qty 50

## 2015-07-30 MED ORDER — OXYCODONE HCL 5 MG PO TABS: 5.0000 mg | ORAL_TABLET | Freq: Four times a day (QID) | ORAL | Status: DC | PRN

## 2015-07-30 MED ORDER — ENOXAPARIN SODIUM 150 MG/ML ~~LOC~~ SOLN
1.0000 mg/kg | Freq: Once | SUBCUTANEOUS | Status: AC
  Administered 2015-07-30: 160 mg via SUBCUTANEOUS
  Filled 2015-07-30: qty 2

## 2015-07-30 MED ORDER — CEPHALEXIN 500 MG PO CAPS: 500.0000 mg | ORAL_CAPSULE | Freq: Four times a day (QID) | ORAL | Status: DC

## 2015-07-30 MED ORDER — SULFAMETHOXAZOLE-TRIMETHOPRIM 800-160 MG PO TABS: 1.0000 | ORAL_TABLET | Freq: Two times a day (BID) | ORAL | Status: DC

## 2015-07-30 NOTE — ED Provider Notes (Signed)
CSN: [id number]     Arrival date & time 07/30/15  1738 History   First MD Initiated Contact with Patient 07/30/15 1814     Chief Complaint  Patient presents with  . Leg Swelling     (Consider location/radiation/quality/duration/timing/severity/associated sxs/prior Treatment) Patient is a 49 y.o. male presenting with leg pain.  Leg Pain Location:  Leg Leg location:  R lower leg Pain details:    Quality:  Aching and sharp   Radiates to:  Does not radiate   Severity:  Mild   Onset quality:  Gradual   Duration:  3 days   Timing:  Constant Chronicity:  New Tetanus status:  Unknown Prior injury to area:  No Relieved by:  None tried Worsened by:  Nothing tried Associated symptoms: no back pain, no fever and no neck pain     Past Medical History  Diagnosis Date  . Hypertension   . High cholesterol   . Morbid obesity ([place])   . Tobacco abuse   . NSTEMI (non-ST elevated myocardial infarction) ([place]) 04/03/2014  . Coronary artery disease 04/03/2014  . Arthritis   . Melanoma ([place])   . 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 Back surgery    . Hip surgery Left     melanoma excision  . Total ankle replacement Left   . Shoulder surgery Right   . Knee arthroscopy Left   . Coronary artery bypass graft N/A 04/04/2014    Procedure: Coronary artery bypass graft times three using left internal mammary artery and right leg greater saphenous vein.;  Surgeon: Rexene Alberts, MD;  Location: [place];  Service: Open Heart Surgery;  Laterality: N/A;  . Intraoperative transesophageal echocardiogram N/A 04/04/2014    Procedure: INTRAOPERATIVE TRANSESOPHAGEAL ECHOCARDIOGRAM;  Surgeon: Rexene Alberts, MD;  Location: [place];  Service: Open Heart Surgery;  Laterality: N/A;  . Left heart catheterization with coronary angiogram N/A 04/03/2014    Procedure: LEFT HEART CATHETERIZATION WITH CORONARY ANGIOGRAM;   Surgeon: Blane Ohara, MD;  Location: [facility] CATH LAB;  Service: Cardiovascular;  Laterality: N/A;   Family History  Problem Relation Age of Onset  . Diabetes Mother   . Heart disease Mother     has ICD  . Heart failure Father   . Heart disease Father    Social History  Substance Use Topics  . Smoking status: Current Every Day Smoker -- 0.50 packs/day for 30 years    Types: Cigarettes  . Smokeless tobacco: Never Used     Comment: NOW SMOKING 1 PK PER DAY  . Alcohol Use: No     Comment: occassional    Review of Systems  Constitutional: Negative for fever and chills.  HENT: Negative for congestion and rhinorrhea.   Eyes: Negative for visual disturbance.  Respiratory: Negative for cough and shortness of breath.   Cardiovascular: Positive for leg swelling. Negative for chest pain.  Gastrointestinal: Negative for vomiting, abdominal pain, diarrhea and constipation.  Endocrine: Negative for polyuria.  Genitourinary: Negative for dysuria and flank pain.  Musculoskeletal: Negative for back pain and neck pain.  Skin: Negative for rash and wound.  Neurological: Negative for dizziness, numbness and headaches.  All other systems reviewed and are negative.     Allergies  Bee venom; Cauliflower; and Cortisone  Home Medications   Prior to Admission medications  Medication Sig Start Date End Date Taking? Authorizing Provider  aspirin EC 325 MG EC tablet Take 1 tablet (325 mg total) by mouth daily. 04/09/14   Donielle Liston Alba, PA-C  atorvastatin (LIPITOR) 80 MG tablet TAKE 1 TABLET DAILY AT 6 PM 02/08/15   Sherren Mocha, MD  furosemide (LASIX) 20 MG tablet Take 1 tablet (20 mg total) by mouth daily as needed. 07/05/15   Sherren Mocha, MD  lisinopril (PRINIVIL,ZESTRIL) 5 MG tablet TAKE 1 TABLET ONCE DAILY 02/08/15   Sherren Mocha, MD  metoprolol (LOPRESSOR) 50 MG tablet TAKE (1) TABLET TWICE A DAY. 02/08/15   Sherren Mocha, MD  sildenafil (REVATIO) 20 MG tablet Take two and a half  tablets by mouth daily as needed for ED 05/30/15   Sherren Mocha, MD   BP 150/69 mmHg  Pulse 85  Temp(Src) 98.6 F (37 C) (Oral)  Resp 20  Ht 6' (1.829 m)  Wt 347 lb 4.8 oz (157.534 kg)  BMI 47.09 kg/m2  SpO2 99% Physical Exam  Constitutional: He is oriented to person, place, and time. He appears well-developed and well-nourished.  HENT:  Head: Normocephalic and atraumatic.  Eyes: Conjunctivae and EOM are normal.  Neck: Normal range of motion. Neck supple.  Cardiovascular: Normal rate and regular rhythm.   Pulmonary/Chest: Effort normal. No respiratory distress.  Abdominal: Soft. There is no tenderness.  Musculoskeletal: Normal range of motion. He exhibits no edema or tenderness.  Neurological: He is alert and oriented to person, place, and time.  Skin: Skin is warm and dry. Rash (see pics below for erythema on lower leg) noted. There is erythema. No pallor.  Nursing note and vitals reviewed.         ED Course  Procedures (including critical care time) Labs Review Labs Reviewed - No data to display  Imaging Review No results found. I have personally reviewed and evaluated these images and lab results as part of my medical decision-making.   EKG Interpretation None      MDM   Final diagnoses:  Cellulitis of right lower extremity   Patient with 3-4 days of right lower extremity redness, swelling and pain. States is always a little bit swollen but this seems to be worse. No fevers but has had some chills and a little bit nausea. No history of blood clots. No history of cellulitis. Exam as documented above. Differential diagnosis is cervicitis versus DVT. Feel like cellulitis is more likely at this point I will treated with antibiotics. However I will give him a dose of Lovenox here in the emergency department in order a DVT study for the morning which she will come back and get. I spoke with him about the natural progression of cellulitis and reasons to come back to the  emergency department versus seeing his primary doctor.  I have personally and contemperaneously reviewed labs and imaging and used in my decision making as above.   A medical screening exam was performed and I feel the patient has had an appropriate workup for their chief complaint at this time and likelihood of emergent condition existing is low. They have been counseled on decision, discharge, follow up and which symptoms necessitate immediate return to the emergency department. They or their family verbally stated understanding and agreement with plan and discharged in stable condition.      Merrily Pew, MD 07/30/15 [phone]

## 2015-07-30 NOTE — ED Notes (Signed)
Pt states understanding of care given and follow up instructions.  Ambulated from ED.  Refused wheelchair

## 2015-07-30 NOTE — ED Notes (Signed)
Pt c/o swelling, redness, and pain to RT leg since Sunday. Pt had veins removed from leg in 2015 for a heart surgery, so leg is typically swollen. Pt states pain radiates from mid thigh down to his foot. Positive Homans sign. Denies SOB. Pt takes 325 ASA daily.

## 2015-07-30 NOTE — ED Notes (Addendum)
Right Calf: 21.5 inches Left Calf 18.75 inches  Redness marked with permanent marker and dated.

## 2015-07-31 ENCOUNTER — Inpatient Hospital Stay (HOSPITAL_COMMUNITY)
Admission: EM | Admit: 2015-07-31 | Discharge: 2015-08-01 | DRG: 603 | Disposition: A | Discharge status: Home / Self Care | Payer: BC Managed Care – PPO | Attending: Family Medicine | Admitting: Family Medicine

## 2015-07-31 ENCOUNTER — Encounter (HOSPITAL_COMMUNITY): Payer: Self-pay | Admitting: Emergency Medicine

## 2015-07-31 ENCOUNTER — Ambulatory Visit (HOSPITAL_COMMUNITY)
Admit: 2015-07-31 | Discharge: 2015-07-31 | Disposition: A | Discharge status: Home / Self Care | Payer: BC Managed Care – PPO | Attending: Emergency Medicine | Admitting: Emergency Medicine

## 2015-07-31 DIAGNOSIS — Z951 Presence of aortocoronary bypass graft: Secondary | ICD-10-CM

## 2015-07-31 DIAGNOSIS — Z833 Family history of diabetes mellitus: Secondary | ICD-10-CM

## 2015-07-31 DIAGNOSIS — I252 Old myocardial infarction: Secondary | ICD-10-CM

## 2015-07-31 DIAGNOSIS — M79661 Pain in right lower leg: Secondary | ICD-10-CM

## 2015-07-31 DIAGNOSIS — I10 Essential (primary) hypertension: Secondary | ICD-10-CM | POA: Diagnosis not present

## 2015-07-31 DIAGNOSIS — I2510 Atherosclerotic heart disease of native coronary artery without angina pectoris: Secondary | ICD-10-CM | POA: Diagnosis present

## 2015-07-31 DIAGNOSIS — M79604 Pain in right leg: Secondary | ICD-10-CM | POA: Diagnosis present

## 2015-07-31 DIAGNOSIS — L03115 Cellulitis of right lower limb: Secondary | ICD-10-CM | POA: Diagnosis not present

## 2015-07-31 DIAGNOSIS — E785 Hyperlipidemia, unspecified: Secondary | ICD-10-CM | POA: Diagnosis present

## 2015-07-31 DIAGNOSIS — F17210 Nicotine dependence, cigarettes, uncomplicated: Secondary | ICD-10-CM | POA: Diagnosis present

## 2015-07-31 DIAGNOSIS — D696 Thrombocytopenia, unspecified: Secondary | ICD-10-CM | POA: Diagnosis present

## 2015-07-31 DIAGNOSIS — Z6841 Body Mass Index (BMI) 40.0 and over, adult: Secondary | ICD-10-CM

## 2015-07-31 DIAGNOSIS — M1990 Unspecified osteoarthritis, unspecified site: Secondary | ICD-10-CM | POA: Diagnosis present

## 2015-07-31 DIAGNOSIS — E7800 Pure hypercholesterolemia, unspecified: Secondary | ICD-10-CM | POA: Diagnosis present

## 2015-07-31 DIAGNOSIS — Z85820 Personal history of malignant melanoma of skin: Secondary | ICD-10-CM | POA: Diagnosis not present

## 2015-07-31 DIAGNOSIS — Z8249 Family history of ischemic heart disease and other diseases of the circulatory system: Secondary | ICD-10-CM

## 2015-07-31 DIAGNOSIS — M7989 Other specified soft tissue disorders: Principal | ICD-10-CM

## 2015-07-31 LAB — CBC WITH DIFFERENTIAL/PLATELET
BASOS ABS: 0 10*3/uL (ref 0.0–0.1)
BASOS PCT: 0 %
EOS ABS: 0.1 10*3/uL (ref 0.0–0.7)
Eosinophils Relative: 1 %
HCT: 40 % (ref 39.0–52.0)
HEMOGLOBIN: 13.4 g/dL (ref 13.0–17.0)
LYMPHS ABS: 1.4 10*3/uL (ref 0.7–4.0)
Lymphocytes Relative: 15 %
MCH: 29.8 pg (ref 26.0–34.0)
MCHC: 33.5 g/dL (ref 30.0–36.0)
MCV: 88.9 fL (ref 78.0–100.0)
Monocytes Absolute: 1 10*3/uL (ref 0.1–1.0)
Monocytes Relative: 11 %
NEUTROS PCT: 73 %
Neutro Abs: 7 10*3/uL (ref 1.7–7.7)
Platelets: 136 10*3/uL — ABNORMAL LOW (ref 150–400)
RBC: 4.5 MIL/uL (ref 4.22–5.81)
RDW: 15 % (ref 11.5–15.5)
WBC: 9.6 10*3/uL (ref 4.0–10.5)

## 2015-07-31 LAB — BASIC METABOLIC PANEL
Anion gap: 6 (ref 5–15)
BUN: 18 mg/dL (ref 6–20)
CALCIUM: 9.1 mg/dL (ref 8.9–10.3)
CHLORIDE: 103 mmol/L (ref 101–111)
CO2: 29 mmol/L (ref 22–32)
CREATININE: 1.1 mg/dL (ref 0.61–1.24)
Glucose, Bld: 96 mg/dL (ref 65–99)
Potassium: 3.7 mmol/L (ref 3.5–5.1)
SODIUM: 138 mmol/L (ref 135–145)

## 2015-07-31 MED ORDER — SODIUM CHLORIDE 0.9 % IV BOLUS (SEPSIS)
500.0000 mL | Freq: Once | INTRAVENOUS | Status: AC
Start: 2015-07-31 — End: 2015-07-31
  Administered 2015-07-31: 500 mL via INTRAVENOUS

## 2015-07-31 MED ORDER — OXYCODONE HCL 5 MG PO TABS
5.0000 mg | ORAL_TABLET | Freq: Four times a day (QID) | ORAL | Status: DC | PRN
  Administered 2015-07-31 – 2015-08-01 (×2): 10 mg via ORAL
  Filled 2015-07-31 (×2): qty 2

## 2015-07-31 MED ORDER — VANCOMYCIN HCL 10 G IV SOLR
2000.0000 mg | Freq: Once | INTRAVENOUS | Status: AC
  Administered 2015-07-31: 2000 mg via INTRAVENOUS
  Filled 2015-07-31: qty 2000

## 2015-07-31 MED ORDER — ONDANSETRON HCL 4 MG PO TABS: 4.0000 mg | ORAL_TABLET | Freq: Four times a day (QID) | ORAL | Status: DC | PRN

## 2015-07-31 MED ORDER — HYDROMORPHONE HCL 1 MG/ML IJ SOLN
1.0000 mg | Freq: Once | INTRAMUSCULAR | Status: AC
  Administered 2015-07-31: 1 mg via INTRAVENOUS
  Filled 2015-07-31: qty 1

## 2015-07-31 MED ORDER — ONDANSETRON HCL 4 MG/2ML IJ SOLN: 4.0000 mg | Freq: Four times a day (QID) | INTRAMUSCULAR | Status: DC | PRN

## 2015-07-31 MED ORDER — METOPROLOL TARTRATE 50 MG PO TABS
50.0000 mg | ORAL_TABLET | Freq: Two times a day (BID) | ORAL | Status: DC
  Administered 2015-07-31 – 2015-08-01 (×2): 50 mg via ORAL
  Filled 2015-07-31 (×2): qty 1

## 2015-07-31 MED ORDER — FUROSEMIDE 20 MG PO TABS
20.0000 mg | ORAL_TABLET | Freq: Every day | ORAL | Status: DC
  Administered 2015-08-01: 20 mg via ORAL
  Filled 2015-07-31: qty 1

## 2015-07-31 MED ORDER — ACETAMINOPHEN 650 MG RE SUPP: 650.0000 mg | Freq: Four times a day (QID) | RECTAL | Status: DC | PRN

## 2015-07-31 MED ORDER — ACETAMINOPHEN 325 MG PO TABS: 650.0000 mg | ORAL_TABLET | Freq: Four times a day (QID) | ORAL | Status: DC | PRN

## 2015-07-31 MED ORDER — ENOXAPARIN SODIUM 40 MG/0.4ML ~~LOC~~ SOLN: 40.0000 mg | SUBCUTANEOUS | Status: DC

## 2015-07-31 MED ORDER — CEFAZOLIN SODIUM-DEXTROSE 2-3 GM-% IV SOLR
2.0000 g | Freq: Three times a day (TID) | INTRAVENOUS | Status: DC
Start: 2015-07-31 — End: 2015-08-01
  Administered 2015-07-31 – 2015-08-01 (×3): 2 g via INTRAVENOUS
  Filled 2015-07-31 (×6): qty 50

## 2015-07-31 MED ORDER — HYDROMORPHONE HCL 1 MG/ML IJ SOLN: 1.0000 mg | INTRAMUSCULAR | Status: DC | PRN

## 2015-07-31 MED ORDER — CEFAZOLIN SODIUM-DEXTROSE 2-3 GM-% IV SOLR
INTRAVENOUS | Status: AC
  Filled 2015-07-31: qty 100

## 2015-07-31 MED ORDER — VANCOMYCIN HCL 10 G IV SOLR
1500.0000 mg | Freq: Two times a day (BID) | INTRAVENOUS | Status: DC
  Filled 2015-07-31: qty 1500

## 2015-07-31 MED ORDER — ATORVASTATIN CALCIUM 40 MG PO TABS
80.0000 mg | ORAL_TABLET | Freq: Every day | ORAL | Status: DC
  Administered 2015-07-31: 80 mg via ORAL
  Filled 2015-07-31: qty 2

## 2015-07-31 MED ORDER — LISINOPRIL 5 MG PO TABS
5.0000 mg | ORAL_TABLET | Freq: Every day | ORAL | Status: DC
  Administered 2015-08-01: 5 mg via ORAL
  Filled 2015-07-31: qty 1

## 2015-07-31 MED ORDER — ENOXAPARIN SODIUM 80 MG/0.8ML ~~LOC~~ SOLN
75.0000 mg | SUBCUTANEOUS | Status: DC
  Administered 2015-07-31: 75 mg via SUBCUTANEOUS
  Filled 2015-07-31: qty 0.8

## 2015-07-31 NOTE — Progress Notes (Signed)
Dose Adjustment - Lovenox for DVT prophylaxis Indication: VTE prophylaxis  Allergies  Allergen Reactions  . Bee Venom Anaphylaxis  . Cauliflower [Brassica Oleracea Italica] Anaphylaxis  . Cortisone Other (See Comments)    Chest pain    Patient Measurements: Height: 6' (182.9 cm) Weight: (!) 346 lb 12.5 oz (157.3 kg) IBW/kg (Calculated) : 77.6   Vital Signs: Temp: 98.1 F (36.7 C) (10/05 1648) Temp Source: Oral (10/05 1648) BP: 113/50 mmHg (10/05 1648) Pulse Rate: 75 (10/05 1648)  Labs:  Recent Labs  07/31/15 1340  HGB 13.4  HCT 40.0  PLT 136*  CREATININE 1.10    Estimated Creatinine Clearance: 128.6 mL/min (by C-G formula based on Cr of 1.1).   Medical History: Past Medical History  Diagnosis Date  . Hypertension   . High cholesterol   . Morbid obesity ([place])   . Tobacco abuse   . NSTEMI (non-ST elevated myocardial infarction) ([place]) 04/03/2014  . Coronary artery disease 04/03/2014  . Arthritis   . Melanoma ([place])   . 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 Aspirin-Salicylamide-Caffeine (BC HEADACHE POWDER PO) Take 1-2 Packages by mouth daily as needed (headache).   07/31/2015 at Unknown time  . atorvastatin (LIPITOR) 80 MG tablet TAKE 1 TABLET DAILY AT 6 PM 30 tablet 5 07/30/2015 at Unknown time  . cephALEXin (KEFLEX) 500 MG capsule Take 1 capsule (500 mg total) by mouth 4 (four) times daily. 40 capsule 0 07/31/2015 at Unknown time  . furosemide (LASIX) 20 MG tablet Take 1 tablet (20 mg total) by mouth daily as needed. (Patient taking differently: Take 20 mg by mouth daily. ) 30 tablet 0 07/31/2015 at Unknown time  . lisinopril (PRINIVIL,ZESTRIL) 5 MG tablet TAKE 1 TABLET ONCE DAILY 30 tablet 5 07/31/2015 at Unknown time  . metoprolol (LOPRESSOR) 50 MG tablet TAKE (1) TABLET TWICE A DAY. 60 tablet 5 07/31/2015 at 0700  .  oxyCODONE (ROXICODONE) 5 MG immediate release tablet Take 1-2 tablets (5-10 mg total) by mouth every 6 (six) hours as needed for breakthrough pain. 30 tablet 0 unknown  . sildenafil (REVATIO) 20 MG tablet Take two and a half tablets by mouth daily as needed for ED 25 tablet 2 unknown  . sulfamethoxazole-trimethoprim (BACTRIM DS,SEPTRA DS) 800-160 MG tablet Take 1 tablet by mouth 2 (two) times daily. 20 tablet 0 07/31/2015 at Unknown time    Assessment: 49 yo obese M with orders for lovenox for DVT pxl.  BMI 46.9 based on wt 157.3 kg and ht 6'.  Of note, patient received lovenox 160 mg sq x 1 last PM (07/30/15) at 1859.  A doppler study of his extremity today was negative for clot.  Now receiving IV abx for cellulitis.  Goal of Therapy:  DVT pxl  Plan:  - Change lovenox to 0.5 mg /kg q24h  = 75 mg sq q24h - Further adjustments per MD  Vonda Antigua 07/31/2015,7:13 PM

## 2015-07-31 NOTE — ED Notes (Signed)
Report attempted. RN to call back.

## 2015-07-31 NOTE — Progress Notes (Signed)
ANTIBIOTIC CONSULT NOTE-Preliminary  Pharmacy Consult for Vancomycin Indication: cellulitis  Allergies  Allergen Reactions  . Bee Venom Anaphylaxis  . Cauliflower [Brassica Oleracea Italica] Anaphylaxis  . Cortisone Other (See Comments)    Chest pain    Patient Measurements: Height: 6' (182.9 cm) Weight: (!) 347 lb (157.398 kg) IBW/kg (Calculated) : 77.6  Vital Signs: Temp: 98.2 F (36.8 C) (10/05 1226) Temp Source: Oral (10/05 1226) BP: 128/58 mmHg (10/05 1226) Pulse Rate: 81 (10/05 1226)  Labs: No results for input(s): WBC, HGB, PLT, LABCREA, CREATININE in the last 72 hours.  CrCl cannot be calculated (Patient has no serum creatinine result on file.).  No results for input(s): VANCOTROUGH, VANCOPEAK, VANCORANDOM, GENTTROUGH, GENTPEAK, GENTRANDOM, TOBRATROUGH, TOBRAPEAK, TOBRARND, AMIKACINPEAK, AMIKACINTROU, AMIKACIN in the last 72 hours.   Microbiology: No results found for this or any previous visit (from the past 720 hour(s)).  Medical History: Past Medical History  Diagnosis Date  . Hypertension   . High cholesterol   . Morbid obesity ([place])   . Tobacco abuse   . NSTEMI (non-ST elevated myocardial infarction) ([place]) 04/03/2014  . Coronary artery disease 04/03/2014  . Arthritis   . Melanoma ([place])   . 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  C/O swelling and redness in RLE getting progressively worse.   Goal of Therapy:  Vancomycin trough level 10-15 mcg/ml  Plan:  Preliminary review of pertinent patient information completed.  Protocol will be initiated with a one-time dose(s) of Vancomycin 2000mg .  Forestine Na clinical pharmacist will complete review during morning rounds to assess patient and finalize treatment regimen.  Hart Robinsons A, RPH 07/31/2015,1:40 PM

## 2015-07-31 NOTE — ED Provider Notes (Signed)
CSN: [id number]     Arrival date & time 07/31/15  1221 History   First MD Initiated Contact with Patient 07/31/15 1254     Chief Complaint  Patient presents with  . Cellulitis     (Consider location/radiation/quality/duration/timing/severity/associated sxs/prior Treatment) HPI.... Lower extremity swelling and erythema for several days. Patient was seen in the emergency department last night and given IV antibiotic's and Lovenox. He returned today for a Doppler study of his extremity which was negative for clot.  Symptoms have persisted.  No fever, sweats, chills. Patient is morbidly obese.  Past Medical History  Diagnosis Date  . Hypertension   . High cholesterol   . Morbid obesity ([place])   . Tobacco abuse   . NSTEMI (non-ST elevated myocardial infarction) ([place]) 04/03/2014  . Coronary artery disease 04/03/2014  . Arthritis   . Melanoma ([place])   . 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 Back surgery    . Hip surgery Left     melanoma excision  . Total ankle replacement Left   . Shoulder surgery Right   . Knee arthroscopy Left   . Coronary artery bypass graft N/A 04/04/2014    Procedure: Coronary artery bypass graft times three using left internal mammary artery and right leg greater saphenous vein.;  Surgeon: Rexene Alberts, MD;  Location: [place];  Service: Open Heart Surgery;  Laterality: N/A;  . Intraoperative transesophageal echocardiogram N/A 04/04/2014    Procedure: INTRAOPERATIVE TRANSESOPHAGEAL ECHOCARDIOGRAM;  Surgeon: Rexene Alberts, MD;  Location: [place];  Service: Open Heart Surgery;  Laterality: N/A;  . Left heart catheterization with coronary angiogram N/A 04/03/2014    Procedure: LEFT HEART CATHETERIZATION WITH CORONARY ANGIOGRAM;  Surgeon: Blane Ohara, MD;  Location: [facility] CATH LAB;  Service: Cardiovascular;  Laterality: N/A;   Family History  Problem Relation Age of  Onset  . Diabetes Mother   . Heart disease Mother     has ICD  . Heart failure Father   . Heart disease Father    Social History  Substance Use Topics  . Smoking status: Current Every Day Smoker -- 0.50 packs/day for 30 years    Types: Cigarettes  . Smokeless tobacco: Never Used     Comment: NOW SMOKING 1 PK PER DAY  . Alcohol Use: No     Comment: occassional    Review of Systems  All other systems reviewed and are negative.     Allergies  Bee venom; Cauliflower; and Cortisone  Home Medications   Prior to Admission medications   Medication Sig Start Date End Date Taking? Authorizing Provider  Aspirin-Salicylamide-Caffeine (BC HEADACHE POWDER PO) Take 1-2 Packages by mouth daily as needed (headache).   Yes Historical Provider, MD  atorvastatin (LIPITOR) 80 MG tablet TAKE 1 TABLET DAILY AT 6 PM 02/08/15  Yes Sherren Mocha, MD  cephALEXin (KEFLEX) 500 MG capsule Take 1 capsule (500 mg total) by mouth 4 (four) times daily. 07/30/15  Yes Merrily Pew, MD  furosemide (LASIX) 20 MG tablet Take 1 tablet (20 mg total) by mouth daily as needed. Patient taking differently: Take 20 mg by mouth daily.  07/05/15  Yes Sherren Mocha, MD  lisinopril (PRINIVIL,ZESTRIL) 5 MG tablet TAKE 1 TABLET ONCE DAILY 02/08/15  Yes Sherren Mocha, MD  metoprolol (LOPRESSOR) 50 MG tablet TAKE (1) TABLET TWICE A DAY. 02/08/15  Yes Sherren Mocha, MD  oxyCODONE (ROXICODONE) 5 MG immediate release tablet Take 1-2 tablets (5-10 mg total) by mouth every 6 (six) hours as needed for breakthrough pain. 07/30/15  Yes Merrily Pew, MD  sildenafil (REVATIO) 20 MG tablet Take two and a half tablets by mouth daily as needed for ED 05/30/15  Yes Sherren Mocha, MD  sulfamethoxazole-trimethoprim (BACTRIM DS,SEPTRA DS) 800-160 MG tablet Take 1 tablet by mouth 2 (two) times daily. 07/30/15  Yes Jason Mesner, MD   BP 120/57 mmHg  Pulse 74  Temp(Src) 98.2 F (36.8 C) (Oral)  Resp 18  Ht 6' (1.829 m)  Wt 347 lb (157.398 kg)   BMI 47.05 kg/m2  SpO2 99% Physical Exam  Constitutional: He is oriented to person, place, and time.  obese  HENT:  Head: Normocephalic and atraumatic.  Eyes: Conjunctivae and EOM are normal. Pupils are equal, round, and reactive to light.  Neck: Normal range of motion. Neck supple.  Cardiovascular: Normal rate and regular rhythm.   Pulmonary/Chest: Effort normal and breath sounds normal.  Abdominal: Soft. Bowel sounds are normal.  Musculoskeletal: Normal range of motion.  Neurological: He is alert and oriented to person, place, and time.  Skin: Skin is warm and dry.  Right lower extremity: Limb is erythematous and edematous from knee distally.  Psychiatric: He has a normal mood and affect. His behavior is normal.  Nursing note and vitals reviewed.   ED Course  Procedures (including critical care time) Labs Review Labs Reviewed  CBC WITH DIFFERENTIAL/PLATELET - Abnormal; Notable for the following:    Platelets 136 (*)    All other components within normal limits  BASIC METABOLIC PANEL    Imaging Review US Venous Img Lower Unilateral Right  07/31/2015   CLINICAL DATA:  49 year old male with a history of right leg swelling  EXAM: RIGHT LOWER EXTREMITY VENOUS DOPPLER ULTRASOUND  TECHNIQUE: Gray-scale sonography with graded compression, as well as color Doppler and duplex ultrasound were performed to evaluate the lower extremity deep venous systems from the level of the common femoral vein and including the common femoral, femoral, profunda femoral, popliteal and calf veins including the posterior tibial, peroneal and gastrocnemius veins when visible. The superficial great saphenous vein was also interrogated. Spectral Doppler was utilized to evaluate flow at rest and with distal augmentation maneuvers in the common femoral, femoral and popliteal veins.  COMPARISON:  None.  FINDINGS: Contralateral Common Femoral Vein: Respiratory phasicity is normal and symmetric with the symptomatic side.  No evidence of thrombus. Normal compressibility.  Common Femoral Vein: No evidence of thrombus. Normal compressibility, respiratory phasicity and response to augmentation.  Saphenofemoral Junction: No evidence of thrombus. Normal compressibility and flow on color Doppler imaging.  Profunda Femoral Vein: No evidence of thrombus. Normal compressibility and flow on color Doppler imaging.  Femoral Vein: No evidence of thrombus. Normal compressibility, respiratory phasicity and response to augmentation.  Popliteal Vein: No evidence of thrombus. Normal compressibility, respiratory phasicity and response to augmentation.  Calf Veins: Limited visualization of calf veins secondary to edema.  Superficial Great Saphenous Vein: No evidence of thrombus. Normal compressibility and flow on color Doppler imaging.  Other Findings:  Edema of the right leg.  IMPRESSION: Sonographic survey of the right lower extremity negative for DVT.  Right leg superficial edema.  Signed,  Dulcy Fanny. Earleen Newport, DO  [facility]  [facility]   Electronically Signed   By: Corrie Mckusick D.O.   On: 07/31/2015 12:09   I have  personally reviewed and evaluated these images and lab results as part of my medical decision-making.   EKG Interpretation None      MDM   Final diagnoses:  Cellulitis of right lower leg    History and physical consistent with cellulitis. Doppler study shows no blood clot. IV vancomycin. Glucose normal. Admit to general medicine.    Nat Christen, MD 07/31/15 [phone]

## 2015-07-31 NOTE — ED Notes (Signed)
Pt states that he has been having swelling, redness, and pain in right lower leg for a while progressively getting worse.

## 2015-07-31 NOTE — H&P (Signed)
[facility] History and Physical  Fernando Schmidt YPP:[id number] DOB: 1966/10/13 DOA: 07/31/2015  Referring physician: Dr. Lacinda Axon, ER PCP: [facility]   Chief Complaint: right leg pain  HPI: Fernando Schmidt is a 49 y.o. male who presented to the emergency room yesterday with complaints of right lower extremity pain and swelling. He was noted to have a significant cellulitis in the emergency room yesterday, was given a dose of intravenous antibiotic and sent home on oral antibiotic. He will return to the hospital today for evaluation with lower extremity Dopplers to rule out DVT. He complains of continuing pain, erythema and swelling in his right lower extremity. He feels he is unable to ambulate. He has also felt feverish and has chills. Symptoms started on 10/2 and has been progressively getting worse. He was referred to the hospital for intravenous antibiotic.   Review of Systems:  Pertinent positives as per HPI, otherwise negative  Past Medical History  Diagnosis Date  . Hypertension   . High cholesterol   . Morbid obesity ([place])   . Tobacco abuse   . NSTEMI (non-ST elevated myocardial infarction) ([place]) 04/03/2014  . Coronary artery disease 04/03/2014  . Arthritis   . Melanoma ([place])   . 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 Back surgery    . Hip surgery Left     melanoma excision  . Total ankle replacement Left   . Shoulder surgery Right   . Knee arthroscopy Left   . Coronary artery bypass graft N/A 04/04/2014    Procedure: Coronary artery bypass graft times three using left internal mammary artery and right leg greater saphenous vein.;  Surgeon: Rexene Alberts, MD;  Location: [place];  Service: Open Heart Surgery;  Laterality: N/A;  . Intraoperative transesophageal echocardiogram N/A 04/04/2014    Procedure: INTRAOPERATIVE TRANSESOPHAGEAL  ECHOCARDIOGRAM;  Surgeon: Rexene Alberts, MD;  Location: [place];  Service: Open Heart Surgery;  Laterality: N/A;  . Left heart catheterization with coronary angiogram N/A 04/03/2014    Procedure: LEFT HEART CATHETERIZATION WITH CORONARY ANGIOGRAM;  Surgeon: Blane Ohara, MD;  Location: [facility] CATH LAB;  Service: Cardiovascular;  Laterality: N/A;   Social History:  reports that he has been smoking Cigarettes.  He has a 15 pack-year smoking history. He has never used smokeless tobacco. He reports that he does not drink alcohol or use illicit drugs.  Allergies  Allergen Reactions  . Bee Venom Anaphylaxis  . Cauliflower [Brassica Oleracea Italica] Anaphylaxis  . Cortisone Other (See Comments)    Chest pain    Family History  Problem Relation Age of Onset  . Diabetes Mother   . Heart disease Mother     has ICD  . Heart failure Father   . Heart disease Father     Prior to Admission medications   Medication Sig Start Date End Date Taking? Authorizing Provider  Aspirin-Salicylamide-Caffeine (BC HEADACHE POWDER PO) Take 1-2 Packages by mouth daily as needed (headache).   Yes Historical Provider, MD  atorvastatin (LIPITOR) 80 MG tablet TAKE 1 TABLET DAILY AT 6 PM 02/08/15  Yes Sherren Mocha, MD  cephALEXin (KEFLEX) 500 MG capsule Take 1 capsule (500 mg total) by mouth 4 (four) times daily. 07/30/15  Yes Merrily Pew, MD  furosemide (LASIX) 20 MG tablet Take 1 tablet (20 mg total)  by mouth daily as needed. Patient taking differently: Take 20 mg by mouth daily.  07/05/15  Yes Sherren Mocha, MD  lisinopril (PRINIVIL,ZESTRIL) 5 MG tablet TAKE 1 TABLET ONCE DAILY 02/08/15  Yes Sherren Mocha, MD  metoprolol (LOPRESSOR) 50 MG tablet TAKE (1) TABLET TWICE A DAY. 02/08/15  Yes Sherren Mocha, MD  oxyCODONE (ROXICODONE) 5 MG immediate release tablet Take 1-2 tablets (5-10 mg total) by mouth every 6 (six) hours as needed for breakthrough pain. 07/30/15  Yes Merrily Pew, MD  sildenafil (REVATIO) 20 MG tablet  Take two and a half tablets by mouth daily as needed for ED 05/30/15  Yes Sherren Mocha, MD  sulfamethoxazole-trimethoprim (BACTRIM DS,SEPTRA DS) 800-160 MG tablet Take 1 tablet by mouth 2 (two) times daily. 07/30/15  Yes Merrily Pew, MD   Physical Exam: Filed Vitals:   07/31/15 1226 07/31/15 1502 07/31/15 1648  BP: 128/58 120/57 113/50  Pulse: 81 74 75  Temp: 98.2 F (36.8 C)  98.1 F (36.7 C)  TempSrc: Oral  Oral  Resp: 20 18 18   Height: 6' (1.829 m)  6' (1.829 m)  Weight: 157.398 kg (347 lb)  157.3 kg (346 lb 12.5 oz)  SpO2: 100% 99% 98%    Wt Readings from Last 3 Encounters:  07/31/15 157.3 kg (346 lb 12.5 oz)  07/30/15 157.534 kg (347 lb 4.8 oz)  04/22/15 154.223 kg (340 lb)    General:  Appears calm and comfortable Eyes: PERRL, normal lids, irises & conjunctiva ENT: grossly normal hearing, lips & tongue Neck: no LAD, masses or thyromegaly Cardiovascular: RRR, no m/r/g. 1+ LE edema R>L. Respiratory: CTA bilaterally, no w/r/r. Normal respiratory effort. Abdomen: soft, ntnd Skin: erythema noted over right lower thigh and lower leg that has been marked with marker and does not appear to be extending past the margins. Skin is warm to touch. No draining lesions noted. Musculoskeletal: grossly normal tone BUE/BLE Psychiatric: grossly normal mood and affect, speech fluent and appropriate Neurologic: grossly non-focal.          Labs on Admission:  Basic Metabolic Panel:  Recent Labs Lab 07/31/15 1340  NA 138  K 3.7  CL 103  CO2 29  GLUCOSE 96  BUN 18  CREATININE 1.10  CALCIUM 9.1   Liver Function Tests: No results for input(s): AST, ALT, ALKPHOS, BILITOT, PROT, ALBUMIN in the last 168 hours. No results for input(s): LIPASE, AMYLASE in the last 168 hours. No results for input(s): AMMONIA in the last 168 hours. CBC:  Recent Labs Lab 07/31/15 1340  WBC 9.6  NEUTROABS 7.0  HGB 13.4  HCT 40.0  MCV 88.9  PLT 136*   Cardiac Enzymes: No results for input(s):  CKTOTAL, CKMB, CKMBINDEX, TROPONINI in the last 168 hours.  BNP (last 3 results) No results for input(s): BNP in the last 8760 hours.  ProBNP (last 3 results) No results for input(s): PROBNP in the last 8760 hours.  CBG: No results for input(s): GLUCAP in the last 168 hours.  Radiological Exams on Admission: US Venous Img Lower Unilateral Right  07/31/2015   CLINICAL DATA:  49 year old male with a history of right leg swelling  EXAM: RIGHT LOWER EXTREMITY VENOUS DOPPLER ULTRASOUND  TECHNIQUE: Gray-scale sonography with graded compression, as well as color Doppler and duplex ultrasound were performed to evaluate the lower extremity deep venous systems from the level of the common femoral vein and including the common femoral, femoral, profunda femoral, popliteal and calf veins including the posterior tibial, peroneal and gastrocnemius veins  when visible. The superficial great saphenous vein was also interrogated. Spectral Doppler was utilized to evaluate flow at rest and with distal augmentation maneuvers in the common femoral, femoral and popliteal veins.  COMPARISON:  None.  FINDINGS: Contralateral Common Femoral Vein: Respiratory phasicity is normal and symmetric with the symptomatic side. No evidence of thrombus. Normal compressibility.  Common Femoral Vein: No evidence of thrombus. Normal compressibility, respiratory phasicity and response to augmentation.  Saphenofemoral Junction: No evidence of thrombus. Normal compressibility and flow on color Doppler imaging.  Profunda Femoral Vein: No evidence of thrombus. Normal compressibility and flow on color Doppler imaging.  Femoral Vein: No evidence of thrombus. Normal compressibility, respiratory phasicity and response to augmentation.  Popliteal Vein: No evidence of thrombus. Normal compressibility, respiratory phasicity and response to augmentation.  Calf Veins: Limited visualization of calf veins secondary to edema.  Superficial Great Saphenous Vein:  No evidence of thrombus. Normal compressibility and flow on color Doppler imaging.  Other Findings:  Edema of the right leg.  IMPRESSION: Sonographic survey of the right lower extremity negative for DVT.  Right leg superficial edema.  Signed,  Dulcy Fanny. Earleen Newport, DO  [facility]  [facility]   Electronically Signed   By: Corrie Mckusick D.O.   On: 07/31/2015 12:09    Assessment/Plan Principal Problem:   Cellulitis of right lower extremity Active Problems:   Hypertension   High cholesterol   Morbid obesity (HCC)   Cellulitis of right leg   1. Cellulitis of the right lower extremity. We'll start the patient on intravenous Ancef. Keep right lower extremity elevated. Continue to monitor for progression. Margins of erythema have been marked with black marker. Continue to follow. 2. Hypertension. Continue outpatient regimen. 3. Hyperlipidemia. Continue statin.  Code Status: full code DVT Prophylaxis: lovenox Family Communication: discussed with patient Disposition Plan: discharge home once improved  Time spent: 42mins  India Jolin [facility] Pager [phone]

## 2015-08-01 DIAGNOSIS — I10 Essential (primary) hypertension: Secondary | ICD-10-CM

## 2015-08-01 DIAGNOSIS — L03115 Cellulitis of right lower limb: Principal | ICD-10-CM

## 2015-08-01 LAB — COMPREHENSIVE METABOLIC PANEL
ALT: 19 U/L (ref 17–63)
AST: 15 U/L (ref 15–41)
Albumin: 3.1 g/dL — ABNORMAL LOW (ref 3.5–5.0)
Alkaline Phosphatase: 76 U/L (ref 38–126)
Anion gap: 7 (ref 5–15)
BUN: 14 mg/dL (ref 6–20)
CHLORIDE: 105 mmol/L (ref 101–111)
CO2: 25 mmol/L (ref 22–32)
CREATININE: 0.98 mg/dL (ref 0.61–1.24)
Calcium: 8.1 mg/dL — ABNORMAL LOW (ref 8.9–10.3)
GFR calc Af Amer: >60 mL/min (ref >60)
GFR calc non Af Amer: >60 mL/min (ref >60)
GLUCOSE: 88 mg/dL (ref 65–99)
Potassium: 3.5 mmol/L (ref 3.5–5.1)
SODIUM: 137 mmol/L (ref 135–145)
Total Bilirubin: 0.8 mg/dL (ref 0.3–1.2)
Total Protein: 6.4 g/dL — ABNORMAL LOW (ref 6.5–8.1)

## 2015-08-01 LAB — CBC
HCT: 37.8 % — ABNORMAL LOW (ref 39.0–52.0)
Hemoglobin: 12.7 g/dL — ABNORMAL LOW (ref 13.0–17.0)
MCH: 29.7 pg (ref 26.0–34.0)
MCHC: 33.6 g/dL (ref 30.0–36.0)
MCV: 88.5 fL (ref 78.0–100.0)
PLATELETS: 131 10*3/uL — AB (ref 150–400)
RBC: 4.27 MIL/uL (ref 4.22–5.81)
RDW: 15.1 % (ref 11.5–15.5)
WBC: 8.4 10*3/uL (ref 4.0–10.5)

## 2015-08-01 NOTE — Plan of Care (Signed)
Problem: Phase I Progression Outcomes Goal: Pain controlled with appropriate interventions Outcome: Completed/Met Date Met:  08/01/15 Pt denies pain. Goal: OOB as tolerated unless otherwise ordered Outcome: Progressing Pt ambulates to bathroom.

## 2015-08-01 NOTE — Progress Notes (Signed)
PROGRESS NOTE  Fernando Schmidt SWF:[id number] DOB: 09-04-1966 DOA: 07/31/2015 PCP: [facility]  Summary: 49 yo male with h/o HTN, HLD, obesity, NSTEMI, CAD who presented to the ED with right leg pain. Patient presented to the ED with significant cellulitis 10/4 and was treated with IV abx and discharged with oral abx. He returned to the ED 10/5 with worsening right leg pain. While in the ED, labs revealed WBC 9.6, Hgb 13.4 and platelet count 136. Venous US of right lower extremity showed superficial edema but no evidence of DVT.  He was admitted for further evaluation and management.  Assessment/Plan: 1. Cellulitis of the right lower extremity, improving with IV abx. RLE doppler negative for DVT 10/5. 2. Thrombocytopenia, stable. Likely related to acute infection. Expect will resolve within the next week.   3. Hypertension. Stable. Continue outpatient regimen. 4. Hyperlipidemia. Continue statin. 5. Morbid obesity   Overall Improved. Change to oral abx..  Discharge home today.   Code Status: full code DVT Prophylaxis: lovenox Family Communication: Discussed with patient who understands and has no concerns at this time. Disposition Plan: Discharge home today.  Murray Hodgkins, MD  [facility]  Pager [phone] If 7PM-7AM, please contact night-coverage at [email or web address], password [facility] 08/01/2015, 7:45 AM  LOS: 1 day   Consultants:    Procedures:    Antibiotics:  Ancef 10/5>>10/6  Keflex 10/6>>10/11  HPI/Subjective: Feels good. Has an appetite today. Redness on his RLE has receded significantly. Has some pain/cramping when ambulating. Wants to go home.  Objective: Filed Vitals:   07/31/15 1502 07/31/15 1648 07/31/15 2201 08/01/15 0531  BP: 120/57 113/50 120/59 121/63  Pulse: 74 75 80 74  Temp:  98.1 F (36.7 C) 98.9 F (37.2 C) 98.6 F (37 C)  TempSrc:  Oral Oral Oral  Resp: 18 18 18 18   Height:  6' (1.829 m)    Weight:  157.3 kg  (346 lb 12.5 oz)    SpO2: 99% 98% 97% 100%    Intake/Output Summary (Last 24 hours) at 08/01/15 0745 Last data filed at 08/01/15 0641  Gross per 24 hour  Intake    580 ml  Output      0 ml  Net    580 ml     Filed Weights   07/31/15 1226 07/31/15 1648  Weight: 157.398 kg (347 lb) 157.3 kg (346 lb 12.5 oz)    Exam: Afebrile, not hypoxic General:  Appears comfortable, calm. Cardiovascular: Regular rate and rhythm, no murmur, rub or gallop. No lower extremity edema. Respiratory: Clear to auscultation bilaterally, no wheezes, rales or rhonchi. Normal respiratory effort. Abdomen: soft, ntnd Skin:   Marked regression of erythema from marked borders RLE. Some decreased edema with wrinkled skin. Erythema stops at ankle, foot appears uninvolved. Perfusion appears intact. No fluctuance or drainage. Psychiatric: grossly normal mood and affect, speech fluent and appropriate  New data reviewed:  Platelets 131 otherwise unremarkable.   CMP unremarkable  Pertinent data since admission:  US Venous Img Lower Unilateral Right Sonographic survey of the right lower extremity negative for DVT. Right leg superficial edema. Vas  Pending data:    Scheduled Meds: . atorvastatin  80 mg Oral q1800  .  ceFAZolin (ANCEF) IV  2 g Intravenous 3 times per day  . enoxaparin (LOVENOX) injection  75 mg Subcutaneous Q24H  . furosemide  20 mg Oral Daily  . lisinopril  5 mg Oral Daily  . metoprolol  50 mg Oral BID   Continuous  Infusions:   Principal Problem:   Cellulitis of right lower extremity Active Problems:   Hypertension   High cholesterol   Morbid obesity ([place])   Cellulitis of right leg    By signing my name below, I, Rosalie Doctor attest that this documentation has been prepared under the direction and in the presence of Murray Hodgkins, MD Electronically signed: Rosalie Doctor, Scribe.  08/01/2015  1:58pm    I personally performed the services described in this  documentation. All medical record entries made by the scribe were at my direction. I have reviewed the chart and agree that the record reflects my personal performance and is accurate and complete. Murray Hodgkins, MD

## 2015-08-01 NOTE — Discharge Summary (Signed)
Physician Discharge Summary  Fernando Schmidt:[id number] DOB: [birth year] DOA: 07/31/2015  PCP: [facility]  Admit date: 07/31/2015 Discharge date: 08/01/2015  Recommendations for Outpatient Follow-up:  1. Follow up with PCP in 1-2 weeks for resolution of cellulitis.   Follow-up Information    Follow up with [facility]. Schedule an appointment as soon as possible for a visit in 1 week.   Contact information:   [address] [place] [address] [phone]      Discharge Diagnoses:  1. Cellulitis of the right lower extremity. 2. Thrombocytopenia.  3. Hypertension.  4. Hyperlipidemia. 5. Morbid obesity  Discharge Condition: Improved Disposition: Home  Diet recommendation: Regular  Filed Weights   07/31/15 1226 07/31/15 1648  Weight: 157.398 kg (347 lb) 157.3 kg (346 lb 12.5 oz)    History of present illness:  49 yo male with h/o HTN, HLD, obesity, NSTEMI, CAD who presented to the ED with right leg pain. Patient presented to the ED with significant cellulitis 10/4 and was treated with IV abx and discharged with oral abx. He returned to the ED 10/5 with worsening right leg pain. While in the ED, labs revealed WBC 9.6, Hgb 13.4 and platelet count 136. Venous US of right lower extremity showed superficial edema but no evidence of DVT. He was admitted for further evaluation and management.  Hospital Course:  Cellulitis of the RLE improved with IV abx. No evidence of complicating features.  WBC remained normal. RLE doppler negative for DVT. Thrombocytopenia, likely related to acute infection remained stable. Suspect it will spontaneously resolve within a week.    1. Cellulitis of the right lower extremity, improving with IV abx. RLE doppler negative for DVT 10/5. 2. Thrombocytopenia, stable. Likely related to acute infection. Expect will resolve within the next week.  3. Hypertension. Stable. Continue outpatient  regimen. 4. Hyperlipidemia. Continue statin. 5. Morbid obesity  Consultants:  none  Procedures:  none  Antibiotics:  Ancef 10/5>>10/6  Keflex 10/6>>10/11   Discharge Instructions  Discharge Instructions    Diet - low sodium heart healthy    Complete by:  As directed      Discharge instructions    Complete by:  As directed   Call your physician or seek immediate medical attention for fever, leg pain, increased redness or worsening of condition.     Increase activity slowly    Complete by:  As directed           Discharge Medication List as of 08/01/2015  3:01 PM    CONTINUE these medications which have NOT CHANGED   Details  Aspirin-Salicylamide-Caffeine (BC HEADACHE POWDER PO) Take 1-2 Packages by mouth daily as needed (headache)., Until Discontinued, Historical Med    atorvastatin (LIPITOR) 80 MG tablet TAKE 1 TABLET DAILY AT 6 PM, Normal    cephALEXin (KEFLEX) 500 MG capsule Take 1 capsule (500 mg total) by mouth 4 (four) times daily., Starting 07/30/2015, Until Discontinued, Print    furosemide (LASIX) 20 MG tablet Take 1 tablet (20 mg total) by mouth daily as needed., Starting 07/05/2015, Until Discontinued, Normal    lisinopril (PRINIVIL,ZESTRIL) 5 MG tablet TAKE 1 TABLET ONCE DAILY, Normal    metoprolol (LOPRESSOR) 50 MG tablet TAKE (1) TABLET TWICE A DAY., Normal    oxyCODONE (ROXICODONE) 5 MG immediate release tablet Take 1-2 tablets (5-10 mg total) by mouth every 6 (six) hours as needed for breakthrough pain., Starting 07/30/2015, Until Discontinued, Print  sildenafil (REVATIO) 20 MG tablet Take two and a half tablets by mouth daily as needed for ED, Normal      STOP taking these medications     sulfamethoxazole-trimethoprim (BACTRIM DS,SEPTRA DS) 800-160 MG tablet        Allergies  Allergen Reactions  . Bee Venom Anaphylaxis  . Cauliflower [Brassica Oleracea Italica] Anaphylaxis  . Cortisone Other (See Comments)    Chest pain    The results of  significant diagnostics from this hospitalization (including imaging, microbiology, ancillary and laboratory) are listed below for reference.    Significant Diagnostic Studies: US Venous Img Lower Unilateral Right  07/31/2015   CLINICAL DATA:  49 year old male with a history of right leg swelling  EXAM: RIGHT LOWER EXTREMITY VENOUS DOPPLER ULTRASOUND  TECHNIQUE: Gray-scale sonography with graded compression, as well as color Doppler and duplex ultrasound were performed to evaluate the lower extremity deep venous systems from the level of the common femoral vein and including the common femoral, femoral, profunda femoral, popliteal and calf veins including the posterior tibial, peroneal and gastrocnemius veins when visible. The superficial great saphenous vein was also interrogated. Spectral Doppler was utilized to evaluate flow at rest and with distal augmentation maneuvers in the common femoral, femoral and popliteal veins.  COMPARISON:  None.  FINDINGS: Contralateral Common Femoral Vein: Respiratory phasicity is normal and symmetric with the symptomatic side. No evidence of thrombus. Normal compressibility.  Common Femoral Vein: No evidence of thrombus. Normal compressibility, respiratory phasicity and response to augmentation.  Saphenofemoral Junction: No evidence of thrombus. Normal compressibility and flow on color Doppler imaging.  Profunda Femoral Vein: No evidence of thrombus. Normal compressibility and flow on color Doppler imaging.  Femoral Vein: No evidence of thrombus. Normal compressibility, respiratory phasicity and response to augmentation.  Popliteal Vein: No evidence of thrombus. Normal compressibility, respiratory phasicity and response to augmentation.  Calf Veins: Limited visualization of calf veins secondary to edema.  Superficial Great Saphenous Vein: No evidence of thrombus. Normal compressibility and flow on color Doppler imaging.  Other Findings:  Edema of the right leg.  IMPRESSION:  Sonographic survey of the right lower extremity negative for DVT.  Right leg superficial edema.  Signed,  Dulcy Fanny. Earleen Newport, DO  [facility]  [facility]   Electronically Signed   By: Corrie Mckusick D.O.   On: 07/31/2015 12:09      Labs: Basic Metabolic Panel:  Recent Labs Lab 07/31/15 1340 08/01/15 0623  NA 138 137  K 3.7 3.5  CL 103 105  CO2 29 25  GLUCOSE 96 88  BUN 18 14  CREATININE 1.10 0.98  CALCIUM 9.1 8.1*   Liver Function Tests:  Recent Labs Lab 08/01/15 0623  AST 15  ALT 19  ALKPHOS 76  BILITOT 0.8  PROT 6.4*  ALBUMIN 3.1*    CBC:  Recent Labs Lab 07/31/15 1340 08/01/15 0623  WBC 9.6 8.4  NEUTROABS 7.0  --   HGB 13.4 12.7*  HCT 40.0 37.8*  MCV 88.9 88.5  PLT 136* 131*      Principal Problem:   Cellulitis of right lower extremity Active Problems:   Hypertension   High cholesterol   Morbid obesity (HCC)   Cellulitis of right leg   Time coordinating discharge: 35 minutes  Signed:  Murray Hodgkins, MD [facility] 08/01/2015, 2:11 PM  By signing my name below, I, Fernando Schmidt attest that this documentation has been prepared under the direction and in the presence of Fernando Quince  Sarajane Jews, MD Electronically signed: Rosalie Schmidt, Scribe.  08/01/2015 1:58pm  I personally performed the services described in this documentation. All medical record entries made by the scribe were at my direction. I have reviewed the chart and agree that the record reflects my personal performance and is accurate and complete. Murray Hodgkins, MD

## 2015-08-01 NOTE — Discharge Instructions (Signed)

## 2015-08-01 NOTE — Progress Notes (Signed)
[place] discharged Home per MD order.  Discharge instructions reviewed and discussed with the patient, all questions and concerns answered. Copy of instructions and work note given to patient.    Medication List    STOP taking these medications        sulfamethoxazole-trimethoprim 800-160 MG tablet  Commonly known as:  BACTRIM DS,SEPTRA DS      TAKE these medications        atorvastatin 80 MG tablet  Commonly known as:  LIPITOR  TAKE 1 TABLET DAILY AT 6 PM     BC HEADACHE POWDER PO  Take 1-2 Packages by mouth daily as needed (headache).     cephALEXin 500 MG capsule  Commonly known as:  KEFLEX  Take 1 capsule (500 mg total) by mouth 4 (four) times daily.     furosemide 20 MG tablet  Commonly known as:  LASIX  Take 1 tablet (20 mg total) by mouth daily as needed.     lisinopril 5 MG tablet  Commonly known as:  PRINIVIL,ZESTRIL  TAKE 1 TABLET ONCE DAILY     metoprolol 50 MG tablet  Commonly known as:  LOPRESSOR  TAKE (1) TABLET TWICE A DAY.     oxyCODONE 5 MG immediate release tablet  Commonly known as:  ROXICODONE  Take 1-2 tablets (5-10 mg total) by mouth every 6 (six) hours as needed for breakthrough pain.     sildenafil 20 MG tablet  Commonly known as:  REVATIO  Take two and a half tablets by mouth daily as needed for ED        Patients skin is clean, dry and intact, no evidence of skin break down. IV site discontinued and catheter remains intact. Site without signs and symptoms of complications. Dressing and pressure applied.  Patient escorted to car in a wheelchair,  no distress noted upon discharge.  Polly Cobia 08/01/2015 3:18 PM

## 2015-08-01 NOTE — Care Management Note (Signed)
Case Management Note  Patient Details  Name: Fernando Schmidt MRN: [id number] Date of Birth: 1966-06-10  Subjective/Objective:                  Pt admitted from home with cellulitis. Pt lives alone and will return home at discharge. Pt is independent with ADL's. Pt receives [facility] at [facility].  Action/Plan: No CM needs noted.   Expected Discharge Date:                  Expected Discharge Plan:  Home/Self Care  In-House Referral:  NA  Discharge planning Services  CM Consult  Post Acute Care Choice:  NA Choice offered to:  NA  DME Arranged:    DME Agency:     HH Arranged:    HH Agency:     Status of Service:  Completed, signed off  Medicare Important Message Given:    Date Medicare IM Given:    Medicare IM give by:    Date Additional Medicare IM Given:    Additional Medicare Important Message give by:     If discussed at [place] of Stay Meetings, dates discussed:    Additional Comments:  Joylene Draft, RN 08/01/2015, 11:39 AM

## 2015-08-02 LAB — HIV ANTIBODY (ROUTINE TESTING W REFLEX): HIV Screen 4th Generation wRfx: NONREACTIVE

## 2015-08-30 ENCOUNTER — Other Ambulatory Visit: Payer: Self-pay | Admitting: Cardiovascular Disease

## 2015-08-30 ENCOUNTER — Other Ambulatory Visit: Payer: Self-pay | Admitting: *Deleted

## 2015-08-30 DIAGNOSIS — I890 Lymphedema, not elsewhere classified: Secondary | ICD-10-CM

## 2015-09-09 ENCOUNTER — Other Ambulatory Visit: Payer: Self-pay | Admitting: Cardiovascular Disease

## 2015-09-27 ENCOUNTER — Encounter: Payer: Self-pay | Admitting: Vascular Surgery

## 2015-10-04 ENCOUNTER — Ambulatory Visit (HOSPITAL_COMMUNITY)
Admission: RE | Admit: 2015-10-04 | Discharge: 2015-10-04 | Disposition: A | Discharge status: Home / Self Care | Payer: BC Managed Care – PPO | Source: Ambulatory Visit | Attending: Vascular Surgery | Admitting: Vascular Surgery

## 2015-10-04 ENCOUNTER — Encounter: Payer: Self-pay | Admitting: Vascular Surgery

## 2015-10-04 ENCOUNTER — Ambulatory Visit (INDEPENDENT_AMBULATORY_CARE_PROVIDER_SITE_OTHER): Payer: BC Managed Care – PPO | Admitting: Vascular Surgery

## 2015-10-04 VITALS — BP 122/69 | HR 78 | Temp 97.7°F | Resp 16 | Ht 72.0 in | Wt 343.6 lb

## 2015-10-04 DIAGNOSIS — I890 Lymphedema, not elsewhere classified: Secondary | ICD-10-CM

## 2015-10-04 DIAGNOSIS — M7989 Other specified soft tissue disorders: Secondary | ICD-10-CM | POA: Diagnosis not present

## 2015-10-04 DIAGNOSIS — M79604 Pain in right leg: Secondary | ICD-10-CM

## 2015-10-04 DIAGNOSIS — I872 Venous insufficiency (chronic) (peripheral): Secondary | ICD-10-CM | POA: Diagnosis not present

## 2015-10-04 NOTE — Progress Notes (Signed)
[facility] [place]  Patient name: Fernando Schmidt MRN: [id number] DOB: 1966/06/19 Sex: male  REASON FOR CONSULT: Worsening right lower extremity edema. Referred by Dr. Holly Bodily.  HPI: Fernando Schmidt is a 49 y.o. male, who is underwent coronary revascularization in June 2015. Vein was taken from the right leg. He states that since that time he's had worsening right lower extremity edema. He was hospitalized in October with cellulitis of the right leg. Of note he works 70 hours a week and is on his feet at work all day. He denies any previous history of DVT. He denies any history of abdominal surgery inguinal surgery abdominal radiation or inguinal radiation. I do not get any clear-cut history of claudication or rest pain.  He does have a history of tobacco abuse smoking 1-1/2-2 packs per day since he was [age] years old.  Past Medical History  Diagnosis Date  . Hypertension   . High cholesterol   . Morbid obesity ([place])   . Tobacco abuse   . NSTEMI (non-ST elevated myocardial infarction) ([place]) 04/03/2014  . Coronary artery disease 04/03/2014  . Arthritis   . Melanoma ([place])   . 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 DVT (deep venous thrombosis) ([facility])     Family History  Problem Relation Age of Onset  . Diabetes Mother   . Heart disease Mother     has ICD  . Heart failure Father   . Heart disease Father     SOCIAL HISTORY: Social History   Social History  . Marital Status: Divorced    Spouse Name: N/A  . Number of Children: N/A  . Years of Education: N/A   Occupational History  . Architect    Social History Main Topics  . Smoking status: Current Every Day Smoker -- 1.00 packs/day for 30 years    Types: Cigarettes  . Smokeless tobacco: Never Used     Comment: NOW SMOKING 1 PK PER DAY  . Alcohol Use: No     Comment: occassional  . Drug Use: No  . Sexual Activity: Yes    Birth  Control/ Protection: Other-see comments     Comment: vasictomy   Other Topics Concern  . Not on file   Social History Narrative   Patient lives alone.    Allergies  Allergen Reactions  . Bee Venom Anaphylaxis  . Cauliflower [Brassica Oleracea Italica] Anaphylaxis  . Cortisone Other (See Comments)    Chest pain    Current Outpatient Prescriptions  Medication Sig Dispense Refill  . Aspirin-Salicylamide-Caffeine (BC HEADACHE POWDER PO) Take 1-2 Packages by mouth daily as needed (headache).    Marland Kitchen atorvastatin (LIPITOR) 80 MG tablet TAKE 1 TABLET DAILY AT 6 PM 30 tablet 5  . furosemide (LASIX) 40 MG tablet Take 40 mg by mouth daily.    Marland Kitchen lisinopril (PRINIVIL,ZESTRIL) 5 MG tablet TAKE 1 TABLET BY MOUTH ONCE DAILY 30 tablet 0  . metoprolol (LOPRESSOR) 50 MG tablet Take 1 tablet (50 mg total) by mouth 2 (two) times daily. 60 tablet 0  . sildenafil (REVATIO) 20 MG tablet Take two and a half tablets by mouth daily as needed for ED 25 tablet 2  . cephALEXin (KEFLEX) 500 MG capsule Take 1 capsule (500 mg total) by mouth 4 (four) times daily. (Patient not taking: Reported on 10/04/2015) 40 capsule 0  . furosemide (LASIX)  20 MG tablet Take 1 tablet (20 mg total) by mouth daily as needed. (Patient not taking: Reported on 10/04/2015) 30 tablet 0  . oxyCODONE (ROXICODONE) 5 MG immediate release tablet Take 1-2 tablets (5-10 mg total) by mouth every 6 (six) hours as needed for breakthrough pain. (Patient not taking: Reported on 10/04/2015) 30 tablet 0   No current facility-administered medications for this visit.    REVIEW OF SYSTEMS:  [X]  denotes positive finding, [ ]  denotes negative finding Cardiac  Comments:  Chest pain or chest pressure:    Shortness of breath upon exertion:    Short of breath when lying flat:    Irregular heart rhythm:        Vascular    Pain in calf, thigh, or hip brought on by ambulation:    Pain in feet at night that wakes you up from your sleep:     Blood clot in your  veins:    Leg swelling:  X       Pulmonary    Oxygen at home:    Productive cough:     Wheezing:         Neurologic    Sudden weakness in arms or legs:     Sudden numbness in arms or legs:     Sudden onset of difficulty speaking or slurred speech:    Temporary loss of vision in one eye:     Problems with dizziness:         Gastrointestinal    Blood in stool:     Vomited blood:         Genitourinary    Burning when urinating:     Blood in urine:        Psychiatric    Major depression:         Hematologic    Bleeding problems:    Problems with blood clotting too easily:        Skin    Rashes or ulcers:        Constitutional    Fever or chills:      PHYSICAL EXAM: Filed Vitals:   10/04/15 1303  BP: 122/69  Pulse: 78  Temp: 97.7 F (36.5 C)  TempSrc: Oral  Resp: 16  Height: 6' (1.829 m)  Weight: 343 lb 9.6 oz (155.856 kg)    GENERAL: The patient is a well-nourished male, in no acute distress. The vital signs are documented above. CARDIAC: There is a regular rate and rhythm.  VASCULAR: I do not detect carotid bruits. He has a palpable right dorsalis pedis pulse. I cannot palpate a dorsalis pedis pulse on the left over the left foot is warm and well-perfused. He has bilateral lower extremity swelling that is significantly worse on the right side. PULMONARY: There is good air exchange bilaterally without wheezing or rales. ABDOMEN: Soft and non-tender with normal pitched bowel sounds.  MUSCULOSKELETAL: There are no major deformities or cyanosis. NEUROLOGIC: No focal weakness or paresthesias are detected. SKIN: There are no ulcers or rashes noted. He has hyperpigmentation bilaterally consistent with chronic venous insufficiency. PSYCHIATRIC: The patient has a normal affect.  DATA:   LOWER EXTREMITY VENOUS DUPLEX: I have independently interpreted his lower extremity venous duplex scan. On the right side there is no evidence of deep venous thrombosis. The right  great saphenous vein is surgically absent. There is reflux in the right common femoral vein, superficial femoral vein, and popliteal vein.  MEDICAL ISSUES:  COMBINED RIGHT LOWER EXTREMITY LYMPHEDEMA AND  CHRONIC VENOUS INSUFFICIENCY: Based on his exam, I think he has combined chronic venous insufficiency and lymphedema. I have explained however that the treatment is the same in that his elevation and compression. Discussed the importance of intermittent leg elevation in the proper positioning for this. He will need to make this part of his lifestyle as this is a chronic problem. In additionI have recommended knee-high compression stockings with a gradient of 15-20 mmHg. I've encouraged him to avoid prolonged sitting and standing. I encouraged him to consider water aerobics which I think is also helpful for people with chronic venous insufficiency. We have also discussed the importance of tobacco cessation. I'll be happy to see him back in the future if any new vascular issues arise.   Deitra Mayo [facility] [place]: [phone]

## 2015-11-28 ENCOUNTER — Other Ambulatory Visit: Payer: Self-pay | Admitting: Cardiovascular Disease

## 2016-02-05 ENCOUNTER — Other Ambulatory Visit: Payer: Self-pay | Admitting: Cardiovascular Disease

## 2016-02-06 ENCOUNTER — Other Ambulatory Visit: Payer: Self-pay | Admitting: *Deleted

## 2016-02-06 MED ORDER — METOPROLOL TARTRATE 50 MG PO TABS: 50.0000 mg | ORAL_TABLET | Freq: Two times a day (BID) | ORAL | Status: AC

## 2016-08-20 ENCOUNTER — Encounter: Payer: Self-pay | Admitting: Nurse Practitioner

## 2016-12-10 DIAGNOSIS — R0683 Snoring: Secondary | ICD-10-CM | POA: Insufficient documentation

## 2016-12-30 DIAGNOSIS — R9439 Abnormal result of other cardiovascular function study: Secondary | ICD-10-CM | POA: Insufficient documentation

## 2017-01-21 DIAGNOSIS — R9430 Abnormal result of cardiovascular function study, unspecified: Secondary | ICD-10-CM | POA: Insufficient documentation

## 2017-06-10 DIAGNOSIS — Z0279 Encounter for issue of other medical certificate: Secondary | ICD-10-CM | POA: Diagnosis not present

## 2017-07-21 DIAGNOSIS — G8929 Other chronic pain: Secondary | ICD-10-CM | POA: Insufficient documentation

## 2019-06-14 ENCOUNTER — Other Ambulatory Visit (HOSPITAL_BASED_OUTPATIENT_CLINIC_OR_DEPARTMENT_OTHER): Payer: Self-pay

## 2019-06-14 DIAGNOSIS — G4710 Hypersomnia, unspecified: Secondary | ICD-10-CM

## 2019-06-14 DIAGNOSIS — R5383 Other fatigue: Secondary | ICD-10-CM

## 2019-06-14 DIAGNOSIS — G4730 Sleep apnea, unspecified: Secondary | ICD-10-CM

## 2019-06-14 DIAGNOSIS — R0683 Snoring: Secondary | ICD-10-CM

## 2019-06-20 ENCOUNTER — Other Ambulatory Visit (HOSPITAL_COMMUNITY)
Admission: RE | Admit: 2019-06-20 | Discharge: 2019-06-20 | Disposition: A | Discharge status: Home / Self Care | Payer: BC Managed Care – PPO | Source: Ambulatory Visit | Attending: Neurology | Admitting: Neurology

## 2019-06-20 ENCOUNTER — Other Ambulatory Visit: Payer: Self-pay

## 2019-06-20 DIAGNOSIS — Z20828 Contact with and (suspected) exposure to other viral communicable diseases: Secondary | ICD-10-CM | POA: Insufficient documentation

## 2019-06-20 DIAGNOSIS — Z01812 Encounter for preprocedural laboratory examination: Secondary | ICD-10-CM | POA: Insufficient documentation

## 2019-06-20 LAB — SARS CORONAVIRUS 2 (TAT 6-24 HRS): SARS Coronavirus 2: NEGATIVE

## 2019-06-23 ENCOUNTER — Ambulatory Visit: Payer: BC Managed Care – PPO | Attending: Emergency Medicine | Admitting: Neurology

## 2019-06-23 ENCOUNTER — Other Ambulatory Visit: Payer: Self-pay

## 2019-06-23 DIAGNOSIS — G4710 Hypersomnia, unspecified: Secondary | ICD-10-CM | POA: Diagnosis not present

## 2019-06-23 DIAGNOSIS — Z79899 Other long term (current) drug therapy: Secondary | ICD-10-CM | POA: Diagnosis not present

## 2019-06-23 DIAGNOSIS — G4733 Obstructive sleep apnea (adult) (pediatric): Secondary | ICD-10-CM | POA: Insufficient documentation

## 2019-06-23 DIAGNOSIS — G4730 Sleep apnea, unspecified: Secondary | ICD-10-CM

## 2019-06-23 DIAGNOSIS — R5383 Other fatigue: Secondary | ICD-10-CM

## 2019-06-23 DIAGNOSIS — R0683 Snoring: Secondary | ICD-10-CM

## 2019-06-25 NOTE — Procedures (Signed)
[place] A. Merlene Laughter, MD     [email or web address]             NOCTURNAL POLYSOMNOGRAPHY   LOCATION: [facility]  Patient Name: Fernando Schmidt, Fernando Schmidt Date: 06/23/2019 Gender: Male D.O.B: 1965/11/26 Age (years): 53 Referring Provider: Vidal Schwalbe Height (inches): 72 Interpreting Physician: Phillips Odor MD, ABSM Weight (lbs): 343 RPSGT: Peak, Robert BMI: 47 MRN: [id number] Neck Size: 22.00 CLINICAL INFORMATION The patient is referred for a split night study with BPAP.  MEDICATIONS Medications self-administered by patient taken the night of the study : N/A  Current Outpatient Medications:  .  Aspirin-Salicylamide-Caffeine (BC HEADACHE POWDER PO), Take 1-2 Packages by mouth daily as needed (headache)., Disp: , Rfl:  .  atorvastatin (LIPITOR) 80 MG tablet, TAKE 1 TABLET DAILY AT 6 PM, Disp: 30 tablet, Rfl: 5 .  cephALEXin (KEFLEX) 500 MG capsule, Take 1 capsule (500 mg total) by mouth 4 (four) times daily. (Patient not taking: Reported on 10/04/2015), Disp: 40 capsule, Rfl: 0 .  furosemide (LASIX) 20 MG tablet, Take 1 tablet (20 mg total) by mouth daily as needed. (Patient not taking: Reported on 10/04/2015), Disp: 30 tablet, Rfl: 0 .  furosemide (LASIX) 40 MG tablet, Take 40 mg by mouth daily., Disp: , Rfl:  .  lisinopril (PRINIVIL,ZESTRIL) 5 MG tablet, TAKE 1 TABLET BY MOUTH ONCE DAILY, Disp: 30 tablet, Rfl: 0 .  metoprolol (LOPRESSOR) 50 MG tablet, Take 1 tablet (50 mg total) by mouth 2 (two) times daily., Disp: 60 tablet, Rfl: 0 .  oxyCODONE (ROXICODONE) 5 MG immediate release tablet, Take 1-2 tablets (5-10 mg total) by mouth every 6 (six) hours as needed for breakthrough pain. (Patient not taking: Reported on 10/04/2015), Disp: 30 tablet, Rfl: 0 .  sildenafil (REVATIO) 20 MG tablet, Take two and a half tablets by mouth daily as needed for ED, Disp: 25 tablet, Rfl: 2   SLEEP STUDY TECHNIQUE As per the AASM Manual for the Scoring of Sleep and  Associated Events v2.3 (April 2016) with a hypopnea requiring 4% desaturations.  The channels recorded and monitored were frontal, central and occipital EEG, electrooculogram (EOG), submentalis EMG (chin), nasal and oral airflow, thoracic and abdominal wall motion, anterior tibialis EMG, snore microphone, electrocardiogram, and pulse oximetry. Bi-level positive airway pressure (BiPAP) was initiated when the patient met split night criteria and was titrated according to treat sleep-disordered breathing.  RESPIRATORY PARAMETERS Diagnostic  Total AHI (/hr): 82.7 RDI (/hr): 83.5 OA Index (/hr): 56.4 CA Index (/hr): 0.4 REM AHI (/hr): N/A NREM AHI (/hr): 82.7 Supine AHI (/hr): 17.1 Non-supine AHI (/hr): 92.42 Min O2 Sat (%): 80.0 Mean O2 (%): 94.2 Time below 88% (min): 9.5   Titration  Optimal IPAP Pressure (cm): 19 Optimal EPAP Pressure (cm): 15 AHI at Optimal Pressure (/hr): 0.0 Min O2 at Optimal Pressure (%): 95.0 Sleep % at Optimal (%): 93 Supine % at Optimal (%): 0      SLEEP ARCHITECTURE The study was initiated at 9:44:13 PM and terminated at 5:50:53 AM. The total recorded time was 486.7 minutes. EEG confirmed total sleep time was 446 minutes yielding a sleep efficiency of 91.6%%. Sleep onset after lights out was 6.2 minutes with a REM latency of 214.0 minutes. The patient spent 10.2%% of the night in stage N1 sleep, 72.0%% in stage N2 sleep, 0.0%% in stage N3 and 17.8% in REM. Wake after sleep onset (WASO) was 34.5 minutes. The Arousal Index was 13.9/hour.  LEG MOVEMENT DATA The total Periodic Limb Movements of Sleep (  PLMS) were 0. The PLMS index was 0.0 .  CARDIAC DATA The 2 lead EKG demonstrated sinus rhythm. The mean heart rate was 100.0 beats per minute. Other EKG findings include: None.   IMPRESSIONS 1. Severe obstructive sleep apnea occurred during the diagnostic portion of the study (AHI = 82.7 /hour). The optimal BIPAP selected for this patient are ( 19 / 15 cm of water).    Delano Metz, MD Diplomate, American Board of Sleep Medicine.  ELECTRONICALLY SIGNED ON:  06/25/2019, 10:42 PM [place] PH: [phone]   FX: [phone] [place]

## 2020-09-06 ENCOUNTER — Emergency Department
Admission: EM | Admit: 2020-09-06 | Discharge: 2020-09-06 | Disposition: A | Discharge status: Home / Self Care | Payer: BC Managed Care – PPO | Attending: Emergency Medicine | Admitting: Emergency Medicine

## 2020-09-06 ENCOUNTER — Other Ambulatory Visit: Payer: Self-pay

## 2020-09-06 ENCOUNTER — Encounter: Payer: Self-pay | Admitting: Emergency Medicine

## 2020-09-06 DIAGNOSIS — I2510 Atherosclerotic heart disease of native coronary artery without angina pectoris: Secondary | ICD-10-CM | POA: Insufficient documentation

## 2020-09-06 DIAGNOSIS — B029 Zoster without complications: Secondary | ICD-10-CM | POA: Diagnosis not present

## 2020-09-06 DIAGNOSIS — Z85820 Personal history of malignant melanoma of skin: Secondary | ICD-10-CM | POA: Diagnosis not present

## 2020-09-06 DIAGNOSIS — I10 Essential (primary) hypertension: Secondary | ICD-10-CM | POA: Insufficient documentation

## 2020-09-06 DIAGNOSIS — Z79899 Other long term (current) drug therapy: Secondary | ICD-10-CM | POA: Insufficient documentation

## 2020-09-06 DIAGNOSIS — F17210 Nicotine dependence, cigarettes, uncomplicated: Secondary | ICD-10-CM | POA: Insufficient documentation

## 2020-09-06 DIAGNOSIS — Z96662 Presence of left artificial ankle joint: Secondary | ICD-10-CM | POA: Insufficient documentation

## 2020-09-06 DIAGNOSIS — R21 Rash and other nonspecific skin eruption: Secondary | ICD-10-CM | POA: Diagnosis present

## 2020-09-06 DIAGNOSIS — Z951 Presence of aortocoronary bypass graft: Secondary | ICD-10-CM | POA: Insufficient documentation

## 2020-09-06 DIAGNOSIS — Z7982 Long term (current) use of aspirin: Secondary | ICD-10-CM | POA: Diagnosis not present

## 2020-09-06 LAB — CBC WITH DIFFERENTIAL/PLATELET
Abs Immature Granulocytes: 0.03 10*3/uL (ref 0.00–0.07)
Basophils Absolute: 0.1 10*3/uL (ref 0.0–0.1)
Basophils Relative: 1 %
Eosinophils Absolute: 0.2 10*3/uL (ref 0.0–0.5)
Eosinophils Relative: 4 %
HCT: 46.5 % (ref 39.0–52.0)
Hemoglobin: 15.6 g/dL (ref 13.0–17.0)
Immature Granulocytes: 1 %
Lymphocytes Relative: 34 %
Lymphs Abs: 1.5 10*3/uL (ref 0.7–4.0)
MCH: 29.9 pg (ref 26.0–34.0)
MCHC: 33.5 g/dL (ref 30.0–36.0)
MCV: 89.1 fL (ref 80.0–100.0)
Monocytes Absolute: 0.3 10*3/uL (ref 0.1–1.0)
Monocytes Relative: 8 %
Neutro Abs: 2.3 10*3/uL (ref 1.7–7.7)
Neutrophils Relative %: 52 %
Platelets: 134 10*3/uL — ABNORMAL LOW (ref 150–400)
RBC: 5.22 MIL/uL (ref 4.22–5.81)
RDW: 14.4 % (ref 11.5–15.5)
WBC: 4.4 10*3/uL (ref 4.0–10.5)
nRBC: 0 % (ref 0.0–0.2)

## 2020-09-06 LAB — COMPREHENSIVE METABOLIC PANEL
ALT: 303 U/L — ABNORMAL HIGH (ref 0–44)
AST: 160 U/L — ABNORMAL HIGH (ref 15–41)
Albumin: 4.2 g/dL (ref 3.5–5.0)
Alkaline Phosphatase: 90 U/L (ref 38–126)
Anion gap: 10 (ref 5–15)
BUN: 13 mg/dL (ref 6–20)
CO2: 25 mmol/L (ref 22–32)
Calcium: 9 mg/dL (ref 8.9–10.3)
Chloride: 103 mmol/L (ref 98–111)
Creatinine, Ser: 1.03 mg/dL (ref 0.61–1.24)
GFR, Estimated: >60 mL/min (ref >60)
Glucose, Bld: 82 mg/dL (ref 70–99)
Potassium: 4 mmol/L (ref 3.5–5.1)
Sodium: 138 mmol/L (ref 135–145)
Total Bilirubin: 0.7 mg/dL (ref 0.3–1.2)
Total Protein: 7.7 g/dL (ref 6.5–8.1)

## 2020-09-06 MED ORDER — HYDROCODONE-ACETAMINOPHEN 5-325 MG PO TABS: 1.0000 | ORAL_TABLET | ORAL | 0 refills | Status: DC | PRN

## 2020-09-06 MED ORDER — CEPHALEXIN 500 MG PO CAPS: 500.0000 mg | ORAL_CAPSULE | Freq: Two times a day (BID) | ORAL | 0 refills | Status: DC

## 2020-09-06 NOTE — ED Provider Notes (Signed)
[facility] Emergency Department Provider Note   ____________________________________________    I have reviewed the triage vital signs and the nursing notes.   HISTORY  Chief Complaint Insect Bite     HPI Fernando Schmidt is a 54 y.o. male who presents with complaints of rash to his scalp.  Patient reports last week he developed an uncomfortable rash to his scalp which he attributes to multiple spider bites.  He reports it is quite painful and burning in nature.  He is not take anything for this.  No fevers or chills reported.  Has not had shingles vaccine.  His vision has not been affected  Past Medical History:  Diagnosis Date  . Arthritis   . Coronary artery disease 04/03/2014  . DVT (deep venous thrombosis) ([place])   . High cholesterol   . Hypertension   . Melanoma ([place])   . Morbid obesity ([place])   . NSTEMI (non-ST elevated myocardial infarction) ([place]) 04/03/2014  . 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 Tobacco abuse     Patient Active Problem List   Diagnosis Date Noted  . Swelling of limb 10/04/2015  . Cellulitis of right lower extremity 07/31/2015  . Cellulitis of right leg 07/31/2015  . 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 NSTEMI (non-ST elevated myocardial infarction) ([place]) 04/03/2014  . Morbid obesity ([place]) 04/03/2014  . Tobacco abuse 04/03/2014  . Coronary artery disease 04/03/2014  . Hypertension   . High cholesterol   . Arthritis     Past Surgical History:  Procedure Laterality Date  . BACK SURGERY    . CORONARY ARTERY BYPASS GRAFT N/A 04/04/2014   Procedure: Coronary artery bypass graft times three using left internal mammary artery and right leg greater saphenous vein.;  Surgeon: Rexene Alberts, MD;  Location: [facility];  Service: Open Heart Surgery;  Laterality: N/A;  . HIP SURGERY Left    melanoma excision  . INTRAOPERATIVE TRANSESOPHAGEAL ECHOCARDIOGRAM N/A 04/04/2014    Procedure: INTRAOPERATIVE TRANSESOPHAGEAL ECHOCARDIOGRAM;  Surgeon: Rexene Alberts, MD;  Location: [facility];  Service: Open Heart Surgery;  Laterality: N/A;  . KNEE ARTHROSCOPY Left   . LEFT HEART CATHETERIZATION WITH CORONARY ANGIOGRAM N/A 04/03/2014   Procedure: LEFT HEART CATHETERIZATION WITH CORONARY ANGIOGRAM;  Surgeon: Blane Ohara, MD;  Location: [facility];  Service: Cardiovascular;  Laterality: N/A;  . SHOULDER SURGERY Right   . TOTAL ANKLE REPLACEMENT Left     Prior to Admission medications   Medication Sig Start Date End Date Taking? Authorizing Provider  Aspirin-Salicylamide-Caffeine (BC HEADACHE POWDER PO) Take 1-2 Packages by mouth daily as needed (headache).    [provider]  atorvastatin (LIPITOR) 80 MG tablet TAKE 1 TABLET DAILY AT 6 PM 02/08/15   Sherren Mocha, MD  cephALEXin (KEFLEX) 500 MG capsule Take 1 capsule (500 mg total) by mouth 2 (two) times daily. 09/06/20   Lavonia Drafts, MD  furosemide (LASIX) 20 MG tablet Take 1 tablet (20 mg total) by mouth daily as needed. Patient not taking: Reported on 10/04/2015 07/05/15   Sherren Mocha, MD  furosemide (LASIX) 40 MG tablet Take 40 mg by mouth daily.    [provider]  HYDROcodone-acetaminophen (NORCO/VICODIN) 5-325 MG tablet Take 1 tablet by mouth every 4 (four) hours as needed for moderate pain. 09/06/20   Lavonia Drafts, MD  lisinopril (PRINIVIL,ZESTRIL) 5 MG tablet TAKE 1 TABLET  BY MOUTH ONCE DAILY 08/30/15   Sherren Mocha, MD  metoprolol (LOPRESSOR) 50 MG tablet Take 1 tablet (50 mg total) by mouth 2 (two) times daily. 02/06/16   Sherren Mocha, MD  oxyCODONE (ROXICODONE) 5 MG immediate release tablet Take 1-2 tablets (5-10 mg total) by mouth every 6 (six) hours as needed for breakthrough pain. Patient not taking: Reported on 10/04/2015 07/30/15   Mesner, Corene Cornea, MD  sildenafil (REVATIO) 20 MG tablet Take two and a half tablets by mouth daily as needed for ED 05/30/15   Sherren Mocha, MD      Allergies Bee venom, Cauliflower Marta Lamas oleracea], and Cortisone  Family History  Problem Relation Age of Onset  . Diabetes Mother   . Heart disease Mother        has ICD  . Heart failure Father   . Heart disease Father     Social History Social History   Tobacco Use  . Smoking status: Current Every Day Smoker    Packs/day: 1.00    Years: 30.00    Pack years: 30.00    Types: Cigarettes  . Smokeless tobacco: Never Used  . Tobacco comment: NOW SMOKING 1 PK PER DAY  Substance Use Topics  . Alcohol use: No    Alcohol/week: 0.0 standard drinks    Comment: occassional  . Drug use: No    Review of Systems  Constitutional: No fever/chills  ENT: No sore throat.   Gastrointestinal: No abdominal pain.  No nausea, no vomiting.    Skin: Negative for rash. Neurological: Negative for headaches     ____________________________________________   PHYSICAL EXAM:  VITAL SIGNS: ED Triage Vitals  Enc Vitals Group     BP 09/06/20 1630 132/74     Pulse Rate 09/06/20 1630 (!) 54     Resp 09/06/20 1630 20     Temp 09/06/20 1630 98.2 F (36.8 C)     Temp Source 09/06/20 1630 Oral     SpO2 09/06/20 1630 100 %     Weight 09/06/20 1631 (!) 147.4 kg (325 lb)     Height 09/06/20 1631 1.829 m (6')     Head Circumference --      Peak Flow --      Pain Score 09/06/20 1631 10     Pain Loc --      Pain Edu? --      Excl. in [place]? --     Constitutional: Alert and oriented. No acute distress.  Eyes: Conjunctivae are normal.  Head: Left-sided rash with scabs extending from central scalp to just above the left eyelid suspicious for shingles Nose: No congestion/rhinnorhea. Mouth/Throat: Mucous membranes are moist.   Cardiovascular: Normal rate, regular rhythm.  Respiratory: Normal respiratory effort.  No retractions.  Musculoskeletal: No lower extremity tenderness nor edema.   Neurologic:  Normal speech and language. No gross focal neurologic deficits are appreciated.    Skin:  Skin is warm, dry.  See above   ____________________________________________   LABS (all labs ordered are listed, but only abnormal results are displayed)  Labs Reviewed  CBC WITH DIFFERENTIAL/PLATELET - Abnormal; Notable for the following components:      Result Value   Platelets 134 (*)    All other components within normal limits  COMPREHENSIVE METABOLIC PANEL - Abnormal; Notable for the following components:   AST 160 (*)    ALT 303 (*)    All other components within normal limits   ____________________________________________  EKG   ____________________________________________  RADIOLOGY  ____________________________________________   PROCEDURES  Procedure(s) performed: No  Procedures   Critical Care performed: No ____________________________________________   INITIAL IMPRESSION / ASSESSMENT AND PLAN / ED COURSE  Pertinent labs & imaging results that were available during my care of the patient were reviewed by me and considered in my medical decision making (see chart for details).  Patient's exam is most consistent with shingles, no change in vision of left eye.  Regardless patient will need close follow-up with ophthalmology, will start patient on pain medication, no indication for antivirals given rash is over a week old.  Will start antibiotics in case any possible superinfection close follow-up with PCP and Ortho as described.   ____________________________________________   FINAL CLINICAL IMPRESSION(S) / ED DIAGNOSES  Final diagnoses:  Herpes zoster without complication      NEW MEDICATIONS STARTED DURING THIS VISIT:  Discharge Medication List as of 09/06/2020  6:56 PM    START taking these medications   Details  HYDROcodone-acetaminophen (NORCO/VICODIN) 5-325 MG tablet Take 1 tablet by mouth every 4 (four) hours as needed for moderate pain., Starting Fri 09/06/2020, Print         Note:  This document was prepared using  Dragon voice recognition software and may include unintentional dictation errors.   Lavonia Drafts, MD 09/06/20 2001

## 2020-09-06 NOTE — ED Triage Notes (Signed)
Pt states was sitting in his chair at home approx 1 week ago. Pt states unsure what bit him but states sudden onset pain to top of his head. Pt with noted black spots to top of his head, mild swelling continues to L eye. Pt states L eye pain at this time.

## 2021-03-27 ENCOUNTER — Telehealth: Payer: Self-pay | Admitting: Orthopedic Surgery

## 2021-03-27 NOTE — Telephone Encounter (Signed)
Patient called to request appointment for knee and pain radiating down leg; states his primary care provider Dr Bartolo Darter is to send referral. Discussed appointment; patient will call back to provider's office to re-request referral, as he said it was about one month ago. Awaiting referral.

## 2021-05-08 ENCOUNTER — Ambulatory Visit: Payer: BC Managed Care – PPO

## 2021-05-08 ENCOUNTER — Encounter: Payer: Self-pay | Admitting: Orthopedic Surgery

## 2021-05-08 ENCOUNTER — Ambulatory Visit: Payer: BC Managed Care – PPO | Admitting: Orthopedic Surgery

## 2021-05-08 ENCOUNTER — Other Ambulatory Visit: Payer: Self-pay

## 2021-05-08 VITALS — BP 144/81 | HR 63 | Ht 72.0 in | Wt 336.8 lb

## 2021-05-08 DIAGNOSIS — M5441 Lumbago with sciatica, right side: Secondary | ICD-10-CM

## 2021-05-08 DIAGNOSIS — G8929 Other chronic pain: Secondary | ICD-10-CM

## 2021-05-08 DIAGNOSIS — M48062 Spinal stenosis, lumbar region with neurogenic claudication: Secondary | ICD-10-CM

## 2021-05-08 DIAGNOSIS — M5442 Lumbago with sciatica, left side: Secondary | ICD-10-CM | POA: Diagnosis not present

## 2021-05-08 DIAGNOSIS — Z6841 Body Mass Index (BMI) 40.0 and over, adult: Secondary | ICD-10-CM

## 2021-05-08 NOTE — Patient Instructions (Signed)
While we are working on your approval for MRI please go ahead and call to schedule your appointment with [facility] within at least one (1) week.   Central Scheduling [phone]   You will also get a call about the Pain management, [facility], you can call them as well if you prefer. May speed things up, phone number [phone]

## 2021-05-08 NOTE — Progress Notes (Signed)
NEW PROBLEM//OFFICE VISIT  Summary assessment and plan:   Fernando Schmidt is 55 years old, he recently had a triple bypass surgery.  He was seen in [facility] by a spine specialist who recommended epidural injections but he did not get them as they did not mesh well as patient and doctor.  He presented to [place] with chronic lower back pain bilateral leg pain bilateral leg numbness bilateral leg weakness unable to stand or walk more than several minutes or several feet relieved by sitting or flexing the spine  Recommend MRI or CT scan depending on compatibility with his cardiac surgery  Pain management  Follow-up after MRI scan  Chief Complaint  Patient presents with   Back Pain    Pt states all of his joints hurt even down to his knuckles/has been hurting like this 5+ years, ever since his heart attack   Fernando Schmidt is 55 years old, he recently had a triple bypass surgery.  He was seen in [facility] by a spine specialist who recommended epidural injections but he did not get them as they did not mesh well as patient and doctor.  He presented to [place] with chronic lower back pain bilateral leg pain bilateral leg numbness bilateral leg weakness unable to stand or walk more than several minutes or several feet relieved by sitting or flexing the spine  Fernando Schmidt is experiencing significant spinal stenosis symptoms    MEDICAL DECISION MAKING  A.  Encounter Diagnoses  Name Primary?   Chronic bilateral low back pain with bilateral sciatica    Spinal stenosis of lumbar region with neurogenic claudication Yes    B. DATA ANALYSED:   IMAGING: Interpretation of images: Images were obtained in the office.  He has degenerative disc disease severe mild curve or asymmetry on the lateral and AP x-ray    Orders: MRI  Outside records reviewed: None   C. MANAGEMENT   As above  No orders of the defined types were placed in this encounter.    BP (!) 144/81   Pulse 63   Ht 6' (1.829 m)   Wt (!)  336 lb 12.8 oz (152.8 kg)   BMI 45.68 kg/m    General appearance: Well-developed well-nourished no gross deformities  Cardiovascular normal pulse and perfusion normal color without edema  Neurologically no sensation loss or deficits or pathologic reflexes  Psychological: Awake alert and oriented x3 mood and affect normal  Skin no lacerations or ulcerations no nodularity no palpable masses, no erythema or nodularity  Musculoskeletal:  Tenderness in the lower back L4-S1 right and left side and midline  Weakness proximally hip flexors bilaterally with 3 out of 5 weakness  Knee ankle feet and toes 5 out of 5 strength   ROS  Multiple joint aches multiple joint pain tired night sweats hearing loss pain after walking as stated back pain joint pain muscle aches depression nervousness memory loss weakness tremor tingling headache dizziness pollen allergy easy bruising flank pain  The patient meets the AMA guidelines for Morbid (severe) obesity with a BMI > 40.0 and I have recommended weight loss.  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   LIMA to LAD, SVG to OM, SVG  to PDA, EVH via right thigh and leg   Tobacco abuse     Past Surgical History:  Procedure Laterality Date   BACK SURGERY     CORONARY ARTERY BYPASS GRAFT N/A 04/04/2014   Procedure: Coronary artery bypass graft times three using left internal mammary artery and right leg greater saphenous vein.;  Surgeon: Rexene Alberts, MD;  Location: [place];  Service: Open Heart Surgery;  Laterality: N/A;   HIP SURGERY Left    melanoma excision   INTRAOPERATIVE TRANSESOPHAGEAL ECHOCARDIOGRAM N/A 04/04/2014   Procedure: INTRAOPERATIVE TRANSESOPHAGEAL ECHOCARDIOGRAM;  Surgeon:  Rexene Alberts, MD;  Location: [place];  Service: Open Heart Surgery;  Laterality: N/A;   KNEE ARTHROSCOPY Left    LEFT HEART CATHETERIZATION WITH CORONARY ANGIOGRAM N/A 04/03/2014   Procedure: LEFT HEART CATHETERIZATION WITH CORONARY ANGIOGRAM;  Surgeon: Blane Ohara, MD;  Location[facility];  Service: Cardiovascular;  Laterality: N/A;   SHOULDER SURGERY Right    TOTAL ANKLE REPLACEMENT Left     Family History  Problem Relation Age of Onset   Diabetes Mother    Heart disease Mother        has ICD   Heart failure Father    Heart disease Father    Social History   Tobacco Use   Smoking status: Every Day    Packs/day: 1.00    Years: 30.00    Pack years: 30.00    Types: Cigarettes   Smokeless tobacco: Never   Tobacco comments:    NOW SMOKING 1 PK PER DAY  Substance Use Topics   Alcohol use: No    Alcohol/week: 0.0 standard drinks    Comment: occassional   Drug use: No    Allergies  Allergen Reactions   Bee Venom Anaphylaxis   Cauliflower [Brassica Oleracea] Anaphylaxis   Cortisone Other (See Comments)    Chest pain Other reaction(s): Other (See Comments) Chest pain   Calcium Oxide Rash    Current Meds  Medication Sig   Aspirin-Caffeine 1000-65 MG PACK Take 1 Dose by mouth in the morning, at noon, and at bedtime.   clonazePAM (KLONOPIN) 1 MG tablet Take 1 mg by mouth 3 (three) times daily as needed.   furosemide (LASIX) 40 MG tablet Take 40 mg by mouth daily.   gabapentin (NEURONTIN) 300 MG capsule Take 600 mg by mouth in the morning, at noon, and at bedtime.   lisinopril (PRINIVIL,ZESTRIL) 5 MG tablet TAKE 1 TABLET BY MOUTH ONCE DAILY   metoprolol (LOPRESSOR) 50 MG tablet Take 1 tablet (50 mg total) by mouth 2 (two) times daily.   rosuvastatin (CRESTOR) 40 MG tablet Take 1 tablet by mouth daily.   sildenafil (REVATIO) 20 MG tablet Take two and a half tablets by mouth daily as needed for ED        Arther Abbott, MD  05/08/2021 3:49 PM

## 2021-05-27 ENCOUNTER — Other Ambulatory Visit: Payer: Self-pay

## 2021-05-27 ENCOUNTER — Ambulatory Visit (HOSPITAL_COMMUNITY)
Admission: RE | Admit: 2021-05-27 | Discharge: 2021-05-27 | Disposition: A | Discharge status: Home / Self Care | Payer: BC Managed Care – PPO | Source: Ambulatory Visit | Attending: Orthopedic Surgery | Admitting: Orthopedic Surgery

## 2021-05-27 DIAGNOSIS — M48062 Spinal stenosis, lumbar region with neurogenic claudication: Secondary | ICD-10-CM | POA: Insufficient documentation

## 2021-05-27 DIAGNOSIS — M5442 Lumbago with sciatica, left side: Secondary | ICD-10-CM | POA: Insufficient documentation

## 2021-05-27 DIAGNOSIS — M5441 Lumbago with sciatica, right side: Secondary | ICD-10-CM | POA: Diagnosis present

## 2021-05-27 DIAGNOSIS — G8929 Other chronic pain: Secondary | ICD-10-CM | POA: Diagnosis present

## 2021-06-05 ENCOUNTER — Other Ambulatory Visit: Payer: Self-pay

## 2021-06-05 ENCOUNTER — Ambulatory Visit: Payer: BC Managed Care – PPO | Admitting: Orthopedic Surgery

## 2021-06-05 ENCOUNTER — Encounter: Payer: Self-pay | Admitting: Orthopedic Surgery

## 2021-06-05 VITALS — BP 144/71 | HR 76 | Ht 72.0 in | Wt 330.0 lb

## 2021-06-05 DIAGNOSIS — M48062 Spinal stenosis, lumbar region with neurogenic claudication: Secondary | ICD-10-CM | POA: Diagnosis not present

## 2021-06-05 MED ORDER — DICLOFENAC SODIUM 75 MG PO TBEC: 75.0000 mg | DELAYED_RELEASE_TABLET | Freq: Two times a day (BID) | ORAL | 2 refills | Status: AC

## 2021-06-05 NOTE — Patient Instructions (Signed)
You have been referred to [facility] on [address] in [place], they will call you with appointment. If you have not heard anything in one week call them to schedule [phone]   

## 2021-06-05 NOTE — Progress Notes (Signed)
Assessment and plan  This 55 year old male has obvious spinal stenosis and with his cardiac condition I am not sure if he can have surgery anyway.  However that is way out of my jurisdiction in terms of trying to decide something is complicated as this  Naftoli needs to really see a spine specialist and a pain management specialist   Chief Complaint  Patient presents with   Back Pain    Results of MRI     Bradlyn is 55 years old, he recently had a triple bypass surgery.  He was seen in [facility] by a spine specialist who recommended epidural injections but he did not get them as they did not mesh well as patient and doctor.  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 Mild left neural foraminal narrowing.   L3-L4: Moderate-severe spinal canal stenosis. Moderate left and severe right neural foraminal narrowing.   L4-L5: Severe spinal canal stenosis/effacement of the thecal sac. Severe bilateral neural foraminal narrowing. Trace anterolisthesis at this level.     Electronically Signed   By: Maurine Simmering   On: 05/28/2021 15:02ollow-up after MRI scan   Chronic illness with exacerbation definitely not stable outside report review, high risk of morbidity from further treatment if it surgery
